# Patient Record
Sex: Female | Born: 1945 | Hispanic: No | Marital: Married | Smoking: Never smoker
Health system: Southern US, Community
[De-identification: ages and names within clinical notes are randomized; demographics above are authoritative.]

## PROBLEM LIST (undated history)

## (undated) DIAGNOSIS — I10 Essential (primary) hypertension: Secondary | ICD-10-CM

## (undated) DIAGNOSIS — J45909 Unspecified asthma, uncomplicated: Secondary | ICD-10-CM

## (undated) DIAGNOSIS — D649 Anemia, unspecified: Secondary | ICD-10-CM

## (undated) DIAGNOSIS — E079 Disorder of thyroid, unspecified: Secondary | ICD-10-CM

## (undated) HISTORY — DX: Essential (primary) hypertension: I10

## (undated) HISTORY — DX: Unspecified asthma, uncomplicated: J45.909

## (undated) HISTORY — DX: Anemia, unspecified: D64.9

## (undated) HISTORY — DX: Disorder of thyroid, unspecified: E07.9

---

## 2000-03-15 ENCOUNTER — Ambulatory Visit (HOSPITAL_COMMUNITY): Admission: RE | Admit: 2000-03-15 | Discharge: 2000-03-15 | Payer: Self-pay | Admitting: Family Medicine

## 2001-05-29 HISTORY — PX: HEMORRHOID SURGERY: SHX153

## 2004-03-23 ENCOUNTER — Ambulatory Visit (HOSPITAL_COMMUNITY): Admission: RE | Admit: 2004-03-23 | Discharge: 2004-03-23 | Payer: Self-pay | Admitting: *Deleted

## 2004-03-23 ENCOUNTER — Encounter (INDEPENDENT_AMBULATORY_CARE_PROVIDER_SITE_OTHER): Payer: Self-pay | Admitting: *Deleted

## 2009-03-09 ENCOUNTER — Telehealth: Payer: Self-pay | Admitting: Internal Medicine

## 2010-06-28 NOTE — Procedures (Signed)
Summary: COLON ([place])   Colonoscopy  Procedure date:  03/23/2004  Findings:      Location:  [facility].    Procedures Next Due Date:    Colonoscopy: 03/2014 Patient Name: [name] [name] MRN:  Procedure Procedures: Colonoscopy CPT: [phone].  Personnel: Endoscopist: Roosvelt Harps, MD.  Referred By: [name] Harries, MD.  Exam Location: Exam performed in Endoscopy Suite. Outpatient  Patient Consent: Procedure, Alternatives, Risks and Benefits discussed, consent obtained, from patient. Consent was obtained by the RN. Consent to be contacted was not given.  Indications Symptoms: Proctalgia.  Average Risk Screening Routine.  History  Current Medications: Patient is not currently taking Coumadin.  Allergies: No known allergies.  Patient Habits Patient smokes. Drinking Status: not currently drinking.  Pre-Exam Physical: Cardio-pulmonary exam, Rectal exam, HEENT exam , Abdominal exam, Extremity exam, Neurological exam WNL.  Exam Exam: Extent of exam reached: Cecum, extent intended: Cecum.  The cecum was identified by appendiceal orifice and IC valve. Patient position: on left side. Colon retroflexion performed. Images taken. ASA Classification: I. Tolerance: excellent.  Monitoring: Pulse and BP monitoring, Oximetry used. Supplemental O2 given.  Colon Prep Used Phospho Soda for colon prep. Prep results: excellent.  Fluoroscopy: Fluoroscopy was not used.  Sedation Meds: Patient assessed and found to be appropriate for moderate (conscious) sedation. Sedation was managed by the Endoscopist. Demerol 60 mg. given IV. Versed 8 mg. given IV.  Findings IMAGE TAKEN: Ascending Colon.  Image #2 attached.  Comments:  Normal.  IMAGE TAKEN: Cecum.  Image #1 attached.  Comments:  Normal.  IMAGE TAKEN: Sigmoid Colon.  Image #3 attached.  Comments:  Normal.  IMAGE TAKEN: Rectum.  Image #4 attached.  Comments:  Normal.   Assessment Normal examination.    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  Unplanned Events: There were no complications. Plans  Post Exam Instructions: Post sedation instructions given.  Medication Plan: Continue current medications.  Patient Education: Patient given standard instructions for: a normal exam. Patient instructed to get routine colonoscopy every 10 years.  Disposition: After procedure patient sent to recovery. After recovery patient sent home.  Scheduling/Referral: Follow-Up prn.

## 2010-06-28 NOTE — Progress Notes (Signed)
Summary: TRIAGE  Phone Note Call from Patient Call back at Home Phone [phone]   Caller: Daughter-Sylvia    Call For: Dr. Juanda Chance Reason for Call: Talk to Nurse Summary of Call: wants to be worked in for hemorrhoids Initial call taken by: Vallarie Mare,  March 09, 2009 11:58 AM  Follow-up for Phone Call        (Had an appt. on 02-04-09, pt. daughter cancelled it.)  Message left for patient to callback.Laureen Ochs LPN  March 09, 2009 12:52 PM Message left for patient to callback.Laureen Ochs LPN  March 10, 2009 10:14 AM  Follow-up by: Laureen Ochs LPN,  March 10, 2009 1:33 PM  Additional Follow-up for Phone Call Additional follow up Details #1::        Pt. daughter states her mother is having rectal pain and hemorrhoids. Pt. has seen other GI MD's in [place], but doesn't want to go back, she wants more opinions because nobody can find out whats wrong. I have advised pt. daughter to have pt. GI records faxed to me and I will have Dr.Brodie review and advise. Additional Follow-up by: Laureen Ochs LPN,  March 10, 2009 1:40 PM    Additional Follow-up for Phone Call Additional follow up Details #2::    I agree.DB Follow-up by: Hart Carwin MD,  March 10, 2009 2:02 PM

## 2010-10-25 ENCOUNTER — Other Ambulatory Visit: Payer: Self-pay | Admitting: Obstetrics & Gynecology

## 2010-10-25 DIAGNOSIS — Z1231 Encounter for screening mammogram for malignant neoplasm of breast: Secondary | ICD-10-CM

## 2010-11-03 ENCOUNTER — Ambulatory Visit (HOSPITAL_COMMUNITY): Payer: Self-pay

## 2010-11-17 ENCOUNTER — Ambulatory Visit (HOSPITAL_COMMUNITY)
Admission: RE | Admit: 2010-11-17 | Discharge: 2010-11-17 | Disposition: A | Discharge status: Home / Self Care | Payer: BC Managed Care – PPO | Source: Ambulatory Visit | Attending: Obstetrics & Gynecology | Admitting: Obstetrics & Gynecology

## 2010-11-17 DIAGNOSIS — Z1231 Encounter for screening mammogram for malignant neoplasm of breast: Secondary | ICD-10-CM | POA: Insufficient documentation

## 2011-07-17 ENCOUNTER — Ambulatory Visit (INDEPENDENT_AMBULATORY_CARE_PROVIDER_SITE_OTHER): Payer: BC Managed Care – PPO | Admitting: Family Medicine

## 2011-07-17 DIAGNOSIS — Z789 Other specified health status: Secondary | ICD-10-CM

## 2011-07-17 DIAGNOSIS — G4700 Insomnia, unspecified: Secondary | ICD-10-CM

## 2011-07-17 DIAGNOSIS — D649 Anemia, unspecified: Secondary | ICD-10-CM

## 2011-07-17 DIAGNOSIS — I10 Essential (primary) hypertension: Secondary | ICD-10-CM

## 2011-07-17 DIAGNOSIS — R51 Headache: Secondary | ICD-10-CM

## 2011-07-17 LAB — LIPID PANEL
Cholesterol: 186 mg/dL (ref 0–200)
HDL: 54 mg/dL (ref >39)
LDL Cholesterol: 120 mg/dL — ABNORMAL HIGH (ref 0–99)
Total CHOL/HDL Ratio: 3.4 Ratio
Triglycerides: 59 mg/dL (ref <150)
VLDL: 12 mg/dL (ref 0–40)

## 2011-07-17 LAB — FERRITIN: Ferritin: 88 ng/mL (ref 10–291)

## 2011-07-17 LAB — BASIC METABOLIC PANEL
BUN: 21 mg/dL (ref 6–23)
CO2: 25 mEq/L (ref 19–32)
Calcium: 9.5 mg/dL (ref 8.4–10.5)
Chloride: 108 mEq/L (ref 96–112)
Creat: 0.65 mg/dL (ref 0.50–1.10)
Glucose, Bld: 102 mg/dL — ABNORMAL HIGH (ref 70–99)
Potassium: 4.4 mEq/L (ref 3.5–5.3)
Sodium: 142 mEq/L (ref 135–145)

## 2011-07-17 LAB — POCT CBC
Granulocyte percent: 64.5 %G (ref 37–80)
HCT, POC: 33.6 % — AB (ref 37.7–47.9)
Hemoglobin: 10.5 g/dL — AB (ref 12.2–16.2)
Lymph, poc: 1.7 (ref 0.6–3.4)
MCH, POC: 20.3 pg — AB (ref 27–31.2)
MCHC: 31.3 g/dL — AB (ref 31.8–35.4)
MCV: 64.9 fL — AB (ref 80–97)
MID (cbc): 0.4 (ref 0–0.9)
POC Granulocyte: 3.7 (ref 2–6.9)
POC LYMPH PERCENT: 29.2 %L (ref 10–50)
POC MID %: 6.3 %M (ref 0–12)
Platelet Count, POC: 198 10*3/uL (ref 142–424)
RBC: 5.18 M/uL (ref 4.04–5.48)
RDW, POC: 16.1 %
WBC: 5.8 10*3/uL (ref 4.6–10.2)

## 2011-07-17 MED ORDER — ALPRAZOLAM 0.25 MG PO TABS: 0.2500 mg | ORAL_TABLET | Freq: Every evening | ORAL | Status: AC | PRN

## 2011-07-17 MED ORDER — ENALAPRIL MALEATE 10 MG PO TABS: 10.0000 mg | ORAL_TABLET | Freq: Every day | ORAL | Status: DC

## 2011-07-17 NOTE — Progress Notes (Signed)
Patient Name: [name] [name] Date of Birth: 1946/04/26 Medical Record Number: [id number] Gender: female Date of Encounter: 07/17/2011  History of Present Illness:  [name] [name] is a 66 y.o. very pleasant female patient who presents with the following:  [place] there is some language barrier.  She has noticed a problem with elevated BP, so she started herself on a [place] medication (enalapril 5mg  equivalent?)  Has been taking this for about 3 weeks.  She noticed a HA which made her concerned about her BP.  Unsure if medication helped with her HA.   Has had a left sided HA every day for the last 20 days more or less.  Better when she is sleeping. Has been taking excedrin one or 2 daily every day since the HA began.   Also has insomnia so she takes a medicatoin called lecsotan (I think a benzodiazepine, also from [place] every night.  This does help with her sleep. Family history of HTN.   Fasting today.    Patient Active Problem List  Diagnoses  . HTN (hypertension)   History reviewed. [name] pertinent past medical history. Past Surgical History  Procedure Date  . Hemorrhoid surgery 2003   History  Substance Use Topics  . Smoking status: Never Smoker   . Smokeless tobacco: Never Used  . Alcohol Use: [name]   Family History  Problem Relation Age of Onset  . Hypertension Mother   . Hypertension Father   . Hypertension Sister    [name] Known Allergies  Medication list has been reviewed and updated.  Review of Systems: As per HPI- otherwise negative.  [name] vision change  Physical Examination: Filed Vitals:   07/17/11 0916  BP: 147/83  Pulse: 62  Temp: 98.3 F (36.8 C)  TempSrc: Oral  Resp: 18  Height: 5' 1.5" (1.562 m)  Weight: 138 lb (62.596 kg)    Body mass index is 25.65 kg/(m^2).  GEN: WDWN, NAD, Non-toxic, A & O x 3.  Looks comfortable.  HEENT: Atraumatic, Normocephalic. Neck supple. [name] masses, [name] LAD.  [name] nodules or tenderness over her left temple.  PEERL, EOMI Ears and Nose: [name] external deformity.  TM wnl, oropharynx wnl.  CV: RRR, [name] M/G/R. [name] JVD. [name] thrill. [name] extra heart sounds. PULM: CTA B, [name] wheezes, crackles, rhonchi. [name] retractions. [name] resp. distress. [name] accessory muscle use. ABD: S, NT, ND, +BS. [name] rebound. [name] HSM. EXTR: [name] c/c/e NEURO Normal gait. Upper and lower extremitis with normal strength and sensation, DTR wnl biceps and patellar tendons PSYCH: Normally interactive. Conversant. Not depressed or anxious appearing.  Calm demeanor.   EKG shows NSR, compared to a past EKG, there are [name] changes.  [name] ST elevation or depression.   Results for orders placed in visit on 07/17/11  POCT CBC      Component Value Range   WBC 5.8  4.6 - 10.2 (K/uL)   Lymph, poc 1.7  0.6 - 3.4    POC LYMPH PERCENT 29.2  10 - 50 (%L)   MID (cbc) 0.4  0 - 0.9    POC MID % 6.3  0 - 12 (%M)   POC Granulocyte 3.7  2 - 6.9    Granulocyte percent 64.5  37 - 80 (%G)   RBC 5.18  4.04 - 5.48 (M/uL)   Hemoglobin 10.5 (*) 12.2 - 16.2 (g/dL)   HCT, POC 40.9 (*) 81.1 - 47.9 (%)   MCV 64.9 (*) 80 - 97 (fL)   MCH, POC 20.3 (*)  27 - 31.2 (pg)   MCHC 31.3 (*) 31.8 - 35.4 (g/dL)   RDW, POC 56.2     Platelet Count, POC 198  142 - 424 (K/uL)   MPV    0 - 99.8 (fL)    Assessment and Plan: 1. HTN (hypertension)  EKG 12-Lead, Basic metabolic panel, Lipid panel, POCT CBC, enalapril (VASOTEC) 10 MG tablet  2. Insomnia  ALPRAZolam (XANAX) 0.25 MG tablet  3. Language Barrier    4. Headache  POCT SEDIMENTATION RATE  5. Anemia  Ferritin   Microcytic anemia: Per patient her last colonoscopy was 3 years ago [place] and it was ok.  Will check ferritin and replace iron as needed.   HA:  Sed rate to rule out TA.  It looks like the lab missed this order so I will request it as a send out. She may be having a rebound HA as a result of taking excedrin every day.  Asked her to D/C this and use plain tylenol if needed.   Insomnia: gave limited supply of xanax for use  qhs prn HTN: continue enalapril, but increased the dose to 10mg  There is some language barrier.  Ms. Meetze is able to understand English but extra time is required.    Will follow up with the rest of her labs.  Plan recheck for BP evaluation soon.  Ms. Brickel is instructed to go to the ED if her HA becomes worse or different.

## 2011-07-20 ENCOUNTER — Telehealth: Payer: Self-pay

## 2011-07-20 ENCOUNTER — Encounter: Payer: Self-pay | Admitting: Family Medicine

## 2011-07-20 NOTE — Telephone Encounter (Signed)
Patient's daughter [name] [name] called to relay information regarding her mother. She states that she was returning a call from Dr. Patsy Lager.  Pt's daughter states that she is feeling better on the new medicine with normal blood pressures and is no longer having headaches.  Pt's daughter would like to know how long her mother should continue to take this medication.  Please call patient or patient's daughter at [phone].

## 2011-07-21 NOTE — Telephone Encounter (Signed)
Called and LM with [name] [name]- so glad her mother is doing well!  Plan to continue the BP med probably long term- we do need to check her back in a couple of weeks to recheck BP and we can go over labs in more detail then- and we need to follow- up on her anemia.

## 2011-09-10 ENCOUNTER — Other Ambulatory Visit: Payer: Self-pay | Admitting: Family Medicine

## 2011-10-08 ENCOUNTER — Other Ambulatory Visit: Payer: Self-pay | Admitting: Family Medicine

## 2013-04-18 ENCOUNTER — Ambulatory Visit (INDEPENDENT_AMBULATORY_CARE_PROVIDER_SITE_OTHER): Payer: BC Managed Care – PPO | Admitting: Internal Medicine

## 2013-04-18 VITALS — BP 120/76 | HR 65 | Temp 98.5°F | Resp 18 | Ht 62.5 in | Wt 147.0 lb

## 2013-04-18 DIAGNOSIS — IMO0001 Reserved for inherently not codable concepts without codable children: Secondary | ICD-10-CM

## 2013-04-18 DIAGNOSIS — Z760 Encounter for issue of repeat prescription: Secondary | ICD-10-CM

## 2013-04-18 DIAGNOSIS — R350 Frequency of micturition: Secondary | ICD-10-CM

## 2013-04-18 DIAGNOSIS — E049 Nontoxic goiter, unspecified: Secondary | ICD-10-CM

## 2013-04-18 DIAGNOSIS — I10 Essential (primary) hypertension: Secondary | ICD-10-CM

## 2013-04-18 DIAGNOSIS — D649 Anemia, unspecified: Secondary | ICD-10-CM

## 2013-04-18 LAB — IRON AND TIBC
%SAT: 30 % (ref 20–55)
Iron: 93 ug/dL (ref 42–145)
TIBC: 306 ug/dL (ref 250–470)
UIBC: 213 ug/dL (ref 125–400)

## 2013-04-18 LAB — POCT URINALYSIS DIPSTICK
Bilirubin, UA: NEGATIVE
Glucose, UA: NEGATIVE
Ketones, UA: NEGATIVE
Nitrite, UA: NEGATIVE
Protein, UA: NEGATIVE
Spec Grav, UA: 1.01
Urobilinogen, UA: 0.2
pH, UA: 7

## 2013-04-18 LAB — POCT CBC
Granulocyte percent: 64.1 %G (ref 37–80)
HCT, POC: 35.2 % — AB (ref 37.7–47.9)
Hemoglobin: 10.6 g/dL — AB (ref 12.2–16.2)
Lymph, poc: 1.9 (ref 0.6–3.4)
MCH, POC: 20.5 pg — AB (ref 27–31.2)
MCHC: 30.1 g/dL — AB (ref 31.8–35.4)
MCV: 68 fL — AB (ref 80–97)
MID (cbc): 0.4 (ref 0–0.9)
POC Granulocyte: 4.2 (ref 2–6.9)
POC LYMPH PERCENT: 29.1 %L (ref 10–50)
POC MID %: 6.8 %M (ref 0–12)
Platelet Count, POC: 187 10*3/uL (ref 142–424)
RBC: 5.17 M/uL (ref 4.04–5.48)
RDW, POC: 15.7 %
WBC: 6.5 10*3/uL (ref 4.6–10.2)

## 2013-04-18 LAB — POCT UA - MICROSCOPIC ONLY
Bacteria, U Microscopic: NEGATIVE
Casts, Ur, LPF, POC: NEGATIVE
Crystals, Ur, HPF, POC: NEGATIVE
Mucus, UA: NEGATIVE
Yeast, UA: NEGATIVE

## 2013-04-18 LAB — COMPREHENSIVE METABOLIC PANEL
ALT: 15 U/L (ref 0–35)
AST: 21 U/L (ref 0–37)
Albumin: 4.1 g/dL (ref 3.5–5.2)
Alkaline Phosphatase: 64 U/L (ref 39–117)
BUN: 16 mg/dL (ref 6–23)
CO2: 27 mEq/L (ref 19–32)
Calcium: 9 mg/dL (ref 8.4–10.5)
Chloride: 102 mEq/L (ref 96–112)
Creat: 0.6 mg/dL (ref 0.50–1.10)
Glucose, Bld: 92 mg/dL (ref 70–99)
Potassium: 4.2 mEq/L (ref 3.5–5.3)
Sodium: 139 mEq/L (ref 135–145)
Total Bilirubin: 0.9 mg/dL (ref 0.3–1.2)
Total Protein: 7.3 g/dL (ref 6.0–8.3)

## 2013-04-18 MED ORDER — ENALAPRIL MALEATE 10 MG PO TABS: 10.0000 mg | ORAL_TABLET | Freq: Every day | ORAL | Status: DC

## 2013-04-18 NOTE — Patient Instructions (Signed)
Goiter Goiter is an enlarged thyroid gland. The thyroid gland sits at the base of the front of the neck. The gland produces hormones that regulate mood, body temperature, pulse rate, and digestion. Most goiters are painless and are not a cause for serious concern. Goiters and conditions that cause goiters can be treated if necessary.  CAUSES  Common causes of goiter include:  Graves disease (causes too much hormone to be produced [hyperthyroidism]).  Hashimoto's disease (causes too little hormone to be produced [hypothyroidism]).  Thyroiditis (inflammation of the thyroid sometimes caused by virus or pregnancy).  Nodular goiter (small bumps form; sometimes called toxic nodular goiter).  Pregnancy.  Thyroid cancer (very few goiters with nodules are cancerous).  Certain medications.  Radiation exposure.  Iodine deficiency (more common in developing countries in inland populations). RISK FACTORS Risk factors for goiter include:  A family history of goiter.  Female gender.  Inadequate iodine in the diet.  Age older than 40 years. SYMPTOMS  Many goiters do not cause symptoms. When symptoms do occur, they may include:  Swelling in the lower part of the neck. This swelling can range from a very small bump to a large lump.  A tight feeling in the throat.  A hoarse voice. Less commonly, a goiter may result in:  Coughing.  Wheezing.  Difficulty swallowing.  Difficulty breathing.  Bulging neck veins.  Dizziness. When a goiter is the result of hyperthyroidism, symptoms may include:  Rapid or irregular heart beat.  Sicknessin your stomach (nausea).  Vomiting.  Diarrhea.  Shaking.  Irritable feeling.  Bulging eyes.  Weight loss.  Heat sensitivity.  Anxiety. When a goiter is the result of hypothyroidism, symptoms may include:  Tiredness.  Dry skin.  Constipation.  Weight gain.  Irregular menstrual cycle.  Depressed mood.  Sensitivity to  cold. DIAGNOSIS  Tests used to diagnose goiter include:  A physical exam.  Blood tests, including thyroid hormone levels and antibody testing.  Ultrasonography, computerized X-ray scan (computed tomography, CT) or computerized magnetic scan (magnetic resonance imaging, MRI).  Thyroid scan (imaging along with safe radioactive injection).  Tissue sample taken (biopsy) of nodules. This is sometimes done to confirm that the nodules are not cancerous. TREATMENT  Treatment will depend on the cause of the goiter. Treatment may include:  Monitoring. In some cases, no treatment is necessary, and your doctor will monitor yourcondition at regular check ups.  Medications and supplements. Thyroid medication (thyroid hormone replacement) is available for hyperthroidism and hypothyroidism.  If inflammation is the cause, over-the-counter medication or steroid medication may be recommended.  Goiters caused by iodine deficiency can be treated with iodine supplements or changes in diet.  Radioactive iodine treatment. Radioactive iodine is injected into the blood. It travels to the thyroid gland, kills thyroid cells, and reduces the size of the gland. This is only used when the thyroid gland is overactive. Lifelong thyroid hormone medication is often necessary after this treatment.  Surgery. A procedure to remove all or part of the gland may be recommended in severe cases or when cancer is the cause. Hormones can be taken to replace the hormones normally produced by the thyroid. HOME CARE INSTRUCTIONS   Take medications as directed.  Follow your caregiver's recommendations for any dietary changes.  Follow up with your caregiver for further examination and testing, as directed. PREVENTION   If you have a family history of goiter, discuss screening with your doctor.  Make sure you are getting enough iodine in your diet.  Use   of iodized table salt can help prevent iodine deficiency. Document  Released: 11/02/2009 Document Revised: 08/07/2011 Document Reviewed: 11/02/2009 [facility] Patient Information 2014 [place], [place]. DASH Diet The DASH diet stands for "Dietary Approaches to Stop Hypertension." It is a healthy eating plan that has been shown to reduce high blood pressure (hypertension) in as little as 14 days, while also possibly providing other significant health benefits. These other health benefits include reducing the risk of breast cancer after menopause and reducing the risk of type 2 diabetes, heart disease, colon cancer, and stroke. Health benefits also include weight loss and slowing kidney failure in patients with chronic kidney disease.  DIET GUIDELINES  Limit salt (sodium). Your diet should contain less than 1500 mg of sodium daily.  Limit refined or processed carbohydrates. Your diet should include mostly whole grains. Desserts and added sugars should be used sparingly.  Include small amounts of heart-healthy fats. These types of fats include nuts, oils, and tub margarine. Limit saturated and trans fats. These fats have been shown to be harmful in the body. CHOOSING FOODS  The following food groups are based on a 2000 calorie diet. See your Registered Dietitian for individual calorie needs. Grains and Grain Products (6 to 8 servings daily)  Eat More Often: Whole-wheat bread, brown rice, whole-grain or wheat pasta, quinoa, popcorn without added fat or salt (air popped).  Eat Less Often: White bread, white pasta, white rice, cornbread. Vegetables (4 to 5 servings daily)  Eat More Often: Fresh, frozen, and canned vegetables. Vegetables may be raw, steamed, roasted, or grilled with a minimal amount of fat.  Eat Less Often/Avoid: Creamed or fried vegetables. Vegetables in a cheese sauce. Fruit (4 to 5 servings daily)  Eat More Often: All fresh, canned (in natural juice), or frozen fruits. Dried fruits without added sugar. One hundred percent fruit juice ( cup [237 mL]  daily).  Eat Less Often: Dried fruits with added sugar. Canned fruit in light or heavy syrup. [employer], Fish, and Poultry (2 servings or less daily. One serving is 3 to 4 oz [85-114 g]).  Eat More Often: Ninety percent or leaner ground beef, tenderloin, sirloin. Round cuts of beef, chicken breast, [place] breast. All fish. Grill, bake, or broil your meat. Nothing should be fried.  Eat Less Often/Avoid: Fatty cuts of meat, [place], or chicken leg, thigh, or wing. Fried cuts of meat or fish. Dairy (2 to 3 servings)  Eat More Often: Low-fat or fat-free milk, low-fat plain or light yogurt, reduced-fat or part-skim cheese.  Eat Less Often/Avoid: Milk (whole, 2%).Whole milk yogurt. Full-fat cheeses. Nuts, Seeds, and Legumes (4 to 5 servings per week)  Eat More Often: All without added salt.  Eat Less Often/Avoid: Salted nuts and seeds, canned beans with added salt. Fats and Sweets (limited)  Eat More Often: Vegetable oils, tub margarines without trans fats, sugar-free gelatin. Mayonnaise and salad dressings.  Eat Less Often/Avoid: Coconut oils, palm oils, butter, stick margarine, cream, half and half, cookies, candy, pie. FOR MORE INFORMATION The Dash Diet Eating Plan: www.dashdiet.org Document Released: 05/04/2011 Document Revised: 08/07/2011 Document Reviewed: 05/04/2011 [facility] Patient Information 2014 [place], [place].

## 2013-04-18 NOTE — Progress Notes (Signed)
  Subjective:    Patient ID: [name] [name], female    DOB: [birth year], 67 y.o.   MRN: [id number]  HPI    Review of Systems     Objective:   Physical Exam        Assessment & Plan:

## 2013-04-18 NOTE — Progress Notes (Signed)
  Subjective:    Patient ID: [name] [name], female    DOB: 06-20-1945, 67 y.o.   MRN: [id number]  [facility] patient to me, has no primary care, speaks only fair english. Pt here for blood pressure recheck she takes enalapril. Her blood pressure been 150/80 in the morning but in the evening 100/60. Enalapril is good choice for her. She has no dizzy spells or syncope. Tolerates meds.  Chart review reveals anemia. No fatigue, anorexia, weight loss, or nite sweats. Review of Systems     Objective:   Physical Exam  Vitals reviewed. Constitutional: She is oriented to person, place, and time. She appears well-developed and well-nourished. No distress.  HENT:  Head: Normocephalic.  Neck: Normal range of motion. No tracheal deviation present. Thyromegaly present.  Cardiovascular: Normal rate, regular rhythm and normal heart sounds.   Pulmonary/Chest: Effort normal.  Musculoskeletal: Normal range of motion.  Lymphadenopathy:    She has no cervical adenopathy.  Neurological: She is alert and oriented to person, place, and time. She exhibits normal muscle tone. Coordination normal.  Skin: No rash noted.  Psychiatric: She has a normal mood and affect.    Results for orders placed in visit on 04/18/13  POCT CBC      Result Value Range   WBC 6.5  4.6 - 10.2 K/uL   Lymph, poc 1.9  0.6 - 3.4   POC LYMPH PERCENT 29.1  10 - 50 %L   MID (cbc) 0.4  0 - 0.9   POC MID % 6.8  0 - 12 %M   POC Granulocyte 4.2  2 - 6.9   Granulocyte percent 64.1  37 - 80 %G   RBC 5.17  4.04 - 5.48 M/uL   Hemoglobin 10.6 (*) 12.2 - 16.2 g/dL   HCT, POC 40.9 (*) 81.1 - 47.9 %   MCV 68.0 (*) 80 - 97 fL   MCH, POC 20.5 (*) 27 - 31.2 pg   MCHC 30.1 (*) 31.8 - 35.4 g/dL   RDW, POC 91.4     Platelet Count, POC 187  142 - 424 K/uL   MPV    0 - 99.8 fL  POCT URINALYSIS DIPSTICK      Result Value Range   Color, UA yellow     Clarity, UA clear     Glucose, UA neg     Bilirubin, UA neg     Ketones, UA neg     Spec  Grav, UA 1.010     Blood, UA trace-lysed     pH, UA 7.0     Protein, UA neg     Urobilinogen, UA 0.2     Nitrite, UA neg     Leukocytes, UA Trace    POCT UA - MICROSCOPIC ONLY      Result Value Range   WBC, Ur, HPF, POC 0-5     RBC, urine, microscopic 1-4     Bacteria, U Microscopic neg     Mucus, UA neg     Epithelial cells, urine per micros 1-4     Crystals, Ur, HPF, POC neg     Casts, Ur, LPF, POC neg     Yeast, UA neg     Anemia microcytic--iron/def versus thallasemia      Assessment & Plan:  RF meds 6 mo W/up anemia and goiter

## 2013-04-19 LAB — VITAMIN B12: Vitamin B-12: 363 pg/mL (ref 211–911)

## 2013-04-19 LAB — TSH: TSH: 2.17 u[IU]/mL (ref 0.350–4.500)

## 2013-04-19 LAB — FOLATE: Folate: >20 ng/mL

## 2013-04-21 LAB — IFOBT (OCCULT BLOOD): IFOBT: NEGATIVE

## 2013-04-22 LAB — HEMOGLOBINOPATHY EVALUATION
Hemoglobin Other: 0 %
Hgb A2 Quant: 5.2 % — ABNORMAL HIGH (ref 2.2–3.2)
Hgb A: 93.8 % — ABNORMAL LOW (ref 96.8–97.8)
Hgb F Quant: 1 % (ref 0.0–2.0)
Hgb S Quant: 0 %

## 2013-04-23 ENCOUNTER — Ambulatory Visit
Admission: RE | Admit: 2013-04-23 | Discharge: 2013-04-23 | Disposition: A | Discharge status: Home / Self Care | Payer: BC Managed Care – PPO | Source: Ambulatory Visit | Attending: Internal Medicine | Admitting: Internal Medicine

## 2013-04-23 DIAGNOSIS — E049 Nontoxic goiter, unspecified: Secondary | ICD-10-CM

## 2013-05-08 ENCOUNTER — Telehealth: Payer: Self-pay

## 2013-05-08 ENCOUNTER — Other Ambulatory Visit: Payer: Self-pay | Admitting: Internal Medicine

## 2013-05-08 DIAGNOSIS — E041 Nontoxic single thyroid nodule: Secondary | ICD-10-CM

## 2013-05-08 NOTE — Telephone Encounter (Signed)
Pt daughter is wanting to talk with someone about get a follow up appt for her mother either another ultrasound which original was 04/23/13 at [facility] imaging or a biopsy   Best number is [phone]

## 2013-05-08 NOTE — Telephone Encounter (Signed)
Left message for [name] [name] to call me back. Ordered the biopsy. Dr Perrin Maltese would like to discuss in office but patient has no insurance. Will explain in detail to daughter.

## 2013-05-12 NOTE — Telephone Encounter (Signed)
Ordered for 12/18.

## 2013-05-15 ENCOUNTER — Ambulatory Visit
Admission: RE | Admit: 2013-05-15 | Discharge: 2013-05-15 | Disposition: A | Discharge status: Home / Self Care | Payer: BC Managed Care – PPO | Source: Ambulatory Visit | Attending: Internal Medicine | Admitting: Internal Medicine

## 2013-05-15 ENCOUNTER — Other Ambulatory Visit (HOSPITAL_COMMUNITY)
Admission: RE | Admit: 2013-05-15 | Discharge: 2013-05-15 | Disposition: A | Discharge status: Home / Self Care | Payer: BC Managed Care – PPO | Source: Ambulatory Visit | Attending: Interventional Radiology | Admitting: Interventional Radiology

## 2013-05-15 DIAGNOSIS — E041 Nontoxic single thyroid nodule: Secondary | ICD-10-CM

## 2013-05-17 ENCOUNTER — Other Ambulatory Visit: Payer: Self-pay

## 2013-05-17 DIAGNOSIS — C73 Malignant neoplasm of thyroid gland: Secondary | ICD-10-CM

## 2013-05-19 ENCOUNTER — Telehealth: Payer: Self-pay

## 2013-05-19 DIAGNOSIS — E049 Nontoxic goiter, unspecified: Secondary | ICD-10-CM

## 2013-05-19 NOTE — Telephone Encounter (Signed)
I called patients daughter, explained everything. Agrees to take her mother to see surgeon.

## 2013-05-19 NOTE — Telephone Encounter (Signed)
Please advise.  Specimen Clinical Information The dominant nodule is solid in mid upper right lobe of 1.8 x 1.7 x 1.7 cm, Thyroid goiter Source Thyroid, Fine Needle Aspiration, Right Lobe Mid Upper, (Specimen 1 of 2, collected on 05/15/13 ) Gross Specimen: Received is/are 30 cc's of pale peach cytolyt solution and 6 slides in 95% ethyl alcohol. (PH:ph) Prepared: # Smears: 6 # Concentration Technique Slides (i.e. ThinPrep): 1 # Cell Block: Cell block attempted, but not obtained. 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 Readily identiable grooves are present. 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 Clinical correlation is advised. Dr. Luisa Hart has seen this case in consultation with agreement

## 2013-05-19 NOTE — Telephone Encounter (Signed)
Pt's daughter Bernita Buffy  would like to know the results of her mothers recent biopsy. She is extremely worried about this and would like to know the results asap. Best# [phone] (work #)

## 2013-05-20 NOTE — Telephone Encounter (Signed)
Thanks I have put in the referral for the surgeon.

## 2013-06-04 ENCOUNTER — Ambulatory Visit (INDEPENDENT_AMBULATORY_CARE_PROVIDER_SITE_OTHER): Payer: Medicare HMO | Admitting: Surgery

## 2013-06-04 ENCOUNTER — Encounter (INDEPENDENT_AMBULATORY_CARE_PROVIDER_SITE_OTHER): Payer: Self-pay | Admitting: Surgery

## 2013-06-04 VITALS — BP 148/82 | HR 74 | Resp 18 | Ht 62.0 in | Wt 144.0 lb

## 2013-06-04 DIAGNOSIS — E041 Nontoxic single thyroid nodule: Secondary | ICD-10-CM

## 2013-06-04 NOTE — Progress Notes (Signed)
Patient ID: [name] [name], female   DOB: July 17, 1945, 68 y.o.   MRN: [id number]  Chief Complaint  Patient presents with  . Other    Eval thyroid ca    HPI [name] [name] is a 68 y.o. female.   HPI This is a pleasant female referred by Dr. Elder Cyphers for evaluation of thyroid nodules. She has had a goiter for the last 30 years. Most recently she had an ultrasound showing a dominant nodule in each lobe. Fine needle aspiration from the right lobe showed both follicular and Hurthle cells. She has been referred here for consideration for thyroidectomy. She is currently asymptomatic.  She has no other complaints. She has no difficulty swallowing from the goiter. She speaks limited English and is accompanied by her daughter who interprets for her Past Medical History  Diagnosis Date  . Hypertension   . Thyroid disease   . Asthma   . Anemia     Past Surgical History  Procedure Laterality Date  . Hemorrhoid surgery  2003    Family History  Problem Relation Age of Onset  . Hypertension Mother   . Hypertension Father   . Hypertension Sister     Social History History  Substance Use Topics  . Smoking status: Never Smoker   . Smokeless tobacco: Never Used  . Alcohol Use: No    No Known Allergies  Current Outpatient Prescriptions  Medication Sig Dispense Refill  . enalapril (VASOTEC) 10 MG tablet Take 1 tablet (10 mg total) by mouth daily.  90 tablet  1   No current facility-administered medications for this visit.    Review of Systems Review of Systems  Constitutional: Negative for fever, chills and unexpected weight change.  HENT: Negative for congestion, hearing loss, sore throat, trouble swallowing and voice change.   Eyes: Negative for visual disturbance.  Respiratory: Negative for cough and wheezing.   Cardiovascular: Negative for chest pain, palpitations and leg swelling.  Gastrointestinal: Negative for nausea, vomiting, abdominal pain, diarrhea, constipation,  blood in stool, abdominal distention and anal bleeding.  Genitourinary: Negative for hematuria, vaginal bleeding and difficulty urinating.  Musculoskeletal: Negative for arthralgias.  Skin: Negative for rash and wound.  Neurological: Negative for seizures, syncope and headaches.  Hematological: Negative for adenopathy. Does not bruise/bleed easily.  Psychiatric/Behavioral: Negative for confusion.    Blood pressure 148/82, pulse 74, resp. rate 18, height 5\' 2"  (1.575 m), weight 144 lb (65.318 kg).  Physical Exam Physical Exam  Constitutional: She is oriented to person, place, and time. She appears well-developed and well-nourished. No distress.  HENT:  Head: Normocephalic and atraumatic.  Right Ear: External ear normal.  Left Ear: External ear normal.  Nose: Nose normal.  Mouth/Throat: Oropharynx is clear and moist. No oropharyngeal exudate.  Eyes: Conjunctivae are normal. Pupils are equal, round, and reactive to light. Right eye exhibits no discharge. Left eye exhibits no discharge. No scleral icterus.  Neck: Normal range of motion. No tracheal deviation present. Thyromegaly present.  Cardiovascular: Normal rate, regular rhythm, normal heart sounds and intact distal pulses.   Pulmonary/Chest: Effort normal and breath sounds normal. She has no wheezes.  Abdominal: Soft. Bowel sounds are normal. She exhibits no distension. There is no tenderness.  Musculoskeletal: Normal range of motion. She exhibits no edema and no tenderness.  Lymphadenopathy:    She has no cervical adenopathy.  Neurological: She is alert and oriented to person, place, and time.  Skin: Skin is warm and dry. No rash noted. She is not  diaphoretic. No erythema.  Psychiatric: Her behavior is normal. Judgment normal.    Data Reviewed I have reviewed her ultrasound as well as her pathology report. She has a dominant nodule in both the right and left thyroid lobes as well as other small nodules. The FNA from the left  thyroid nodule is consistent with goiter. The FNA from the right thyroid nodule shows both follicular and Hurthle cells. Malignancy cannot be ruled out  Assessment    Thyroid nodule with Hrthle cells     Plan    I discussed this with the patient and her daughter in detail. I would recommend right thyroid lobectomy so they can be determined whether or not malignancy is present. I discussed the surgical procedure with them in detail and gave him literature regarding the surgery. I explained a total thyroidectomy would be necessary should malignancy be found in the right thyroid nodule. At this point, therefore discussed this with the family before deciding whether or not to proceed with surgery        Chenay Nesmith A 06/04/2013, 2:35 PM

## 2013-06-06 ENCOUNTER — Telehealth (INDEPENDENT_AMBULATORY_CARE_PROVIDER_SITE_OTHER): Payer: Self-pay | Admitting: Surgery

## 2013-06-06 NOTE — Telephone Encounter (Signed)
Pt would like proceed with surgery  They have additional question  Please contact [facility] # [phone]

## 2013-06-09 ENCOUNTER — Other Ambulatory Visit (INDEPENDENT_AMBULATORY_CARE_PROVIDER_SITE_OTHER): Payer: Self-pay | Admitting: Surgery

## 2013-06-18 ENCOUNTER — Encounter (HOSPITAL_COMMUNITY): Payer: Self-pay

## 2013-06-18 ENCOUNTER — Encounter (HOSPITAL_COMMUNITY)
Admission: RE | Admit: 2013-06-18 | Discharge: 2013-06-18 | Disposition: A | Discharge status: Home / Self Care | Payer: Medicare HMO | Source: Ambulatory Visit | Attending: Surgery | Admitting: Surgery

## 2013-06-18 ENCOUNTER — Encounter (HOSPITAL_COMMUNITY): Payer: Self-pay | Admitting: Pharmacy Technician

## 2013-06-18 DIAGNOSIS — Z01818 Encounter for other preprocedural examination: Secondary | ICD-10-CM | POA: Insufficient documentation

## 2013-06-18 DIAGNOSIS — Z01812 Encounter for preprocedural laboratory examination: Secondary | ICD-10-CM | POA: Insufficient documentation

## 2013-06-18 DIAGNOSIS — Z01810 Encounter for preprocedural cardiovascular examination: Secondary | ICD-10-CM | POA: Insufficient documentation

## 2013-06-18 DIAGNOSIS — Z01811 Encounter for preprocedural respiratory examination: Secondary | ICD-10-CM | POA: Insufficient documentation

## 2013-06-18 LAB — BASIC METABOLIC PANEL
BUN: 13 mg/dL (ref 6–23)
CO2: 27 mEq/L (ref 19–32)
Calcium: 9.1 mg/dL (ref 8.4–10.5)
Chloride: 102 mEq/L (ref 96–112)
Creatinine, Ser: 0.65 mg/dL (ref 0.50–1.10)
GFR calc Af Amer: >90 mL/min (ref >90)
GFR calc non Af Amer: >90 mL/min (ref >90)
Glucose, Bld: 107 mg/dL — ABNORMAL HIGH (ref 70–99)
Potassium: 3.9 mEq/L (ref 3.7–5.3)
Sodium: 142 mEq/L (ref 137–147)

## 2013-06-18 LAB — CBC
HCT: 34 % — ABNORMAL LOW (ref 36.0–46.0)
Hemoglobin: 10.8 g/dL — ABNORMAL LOW (ref 12.0–15.0)
MCH: 20.9 pg — ABNORMAL LOW (ref 26.0–34.0)
MCHC: 31.8 g/dL (ref 30.0–36.0)
MCV: 65.8 fL — ABNORMAL LOW (ref 78.0–100.0)
Platelets: 176 10*3/uL (ref 150–400)
RBC: 5.17 MIL/uL — ABNORMAL HIGH (ref 3.87–5.11)
RDW: 15.3 % (ref 11.5–15.5)
WBC: 6.1 10*3/uL (ref 4.0–10.5)

## 2013-06-18 NOTE — Progress Notes (Signed)
Pt denied need for  interpreter at PAT , husband at side and able to understand english better than pt.  When asked if daughter would be here DOS to interpret husband states did not know.  Questioned again if needed an interpreter DOS and refused.

## 2013-06-18 NOTE — Pre-Procedure Instructions (Signed)
TRACIA LACOMB  06/18/2013   Your procedure is scheduled on:  June 24, 2013  Report to [facility] (Entrance A) at 12:30 PM.  Call this number if you have problems the morning of surgery: [phone]   Remember:   Do not eat food or drink liquids after midnight.   Take these medicines the morning of surgery with A SIP OF WATER: none   Discontinue aspirin, coumadin, effient, plavix and aspirin containing medications 5 days prior to surgery.  Stop NSAIDS ie: ibuprofen, naproxen   Do not wear jewelry, make-up or nail polish.  Do not wear lotions, powders, or perfumes. You may wear deodorant.  Do not shave 48 hours prior to surgery. Men may shave face and neck.  Do not bring valuables to the hospital.  [facility] is not responsible  for any belongings or valuables.               Contacts, dentures or bridgework may not be worn into surgery.  Leave suitcase in the car. After surgery it may be brought to your room.  For patients admitted to the hospital, discharge time is determined by your                treatment team.               Patients discharged the day of surgery will not be allowed to drive  home.  Name and phone number of your driver:   Special Instructions: Shower using CHG 2 nights before surgery and the night before surgery.  If you shower the day of surgery use CHG.  Use special wash - you have one bottle of CHG for all showers.  You should use approximately 1/3 of the bottle for each shower.   Please read over the following fact sheets that you were given: Pain Booklet, Coughing and Deep Breathing and Surgical Site Infection Prevention

## 2013-06-19 ENCOUNTER — Other Ambulatory Visit (HOSPITAL_COMMUNITY): Payer: BC Managed Care – PPO

## 2013-06-23 MED ORDER — CEFAZOLIN SODIUM-DEXTROSE 2-3 GM-% IV SOLR
2.0000 g | INTRAVENOUS | Status: AC
  Administered 2013-06-24: 2 g via INTRAVENOUS
  Filled 2013-06-23: qty 50

## 2013-06-23 NOTE — H&P (Signed)
Chief Complaint   Patient presents with   .  Other     Eval thyroid nodule  HPI  [name] [name] is a 68 y.o. female.  HPI  This is a pleasant female referred by Dr. Elder Cyphers for evaluation of thyroid nodules. She has had a goiter for the last 30 years. Most recently she had an ultrasound showing a dominant nodule in each lobe. Fine needle aspiration from the right lobe showed both follicular and Hurthle cells. She has been referred here for consideration for thyroidectomy. She is currently asymptomatic. She has no other complaints. She has no difficulty swallowing from the goiter. She speaks limited English and is accompanied by her daughter who interprets for her  Past Medical History   Diagnosis  Date   .  Hypertension    .  Thyroid disease    .  Asthma    .  Anemia     Past Surgical History   Procedure  Laterality  Date   .  Hemorrhoid surgery   2003    Family History   Problem  Relation  Age of Onset   .  Hypertension  Mother    .  Hypertension  Father    .  Hypertension  Sister    Social History  History   Substance Use Topics   .  Smoking status:  Never Smoker   .  Smokeless tobacco:  Never Used   .  Alcohol Use:  No   No Known Allergies  Current Outpatient Prescriptions   Medication  Sig  Dispense  Refill   .  enalapril (VASOTEC) 10 MG tablet  Take 1 tablet (10 mg total) by mouth daily.  90 tablet  1    No current facility-administered medications for this visit.   Review of Systems  Review of Systems  Constitutional: Negative for fever, chills and unexpected weight change.  HENT: Negative for congestion, hearing loss, sore throat, trouble swallowing and voice change.  Eyes: Negative for visual disturbance.  Respiratory: Negative for cough and wheezing.  Cardiovascular: Negative for chest pain, palpitations and leg swelling.  Gastrointestinal: Negative for nausea, vomiting, abdominal pain, diarrhea, constipation, blood in stool, abdominal distention and anal  bleeding.  Genitourinary: Negative for hematuria, vaginal bleeding and difficulty urinating.  Musculoskeletal: Negative for arthralgias.  Skin: Negative for rash and wound.  Neurological: Negative for seizures, syncope and headaches.  Hematological: Negative for adenopathy. Does not bruise/bleed easily.  Psychiatric/Behavioral: Negative for confusion.  Blood pressure 148/82, pulse 74, resp. rate 18, height 5\' 2"  (1.575 m), weight 144 lb (65.318 kg).  Physical Exam  Physical Exam  Constitutional: She is oriented to person, place, and time. She appears well-developed and well-nourished. No distress.  HENT:  Head: Normocephalic and atraumatic.  Right Ear: External ear normal.  Left Ear: External ear normal.  Nose: Nose normal.  Mouth/Throat: Oropharynx is clear and moist. No oropharyngeal exudate.  Eyes: Conjunctivae are normal. Pupils are equal, round, and reactive to light. Right eye exhibits no discharge. Left eye exhibits no discharge. No scleral icterus.  Neck: Normal range of motion. No tracheal deviation present. Thyromegaly present.  Cardiovascular: Normal rate, regular rhythm, normal heart sounds and intact distal pulses.  Pulmonary/Chest: Effort normal and breath sounds normal. She has no wheezes.  Abdominal: Soft. Bowel sounds are normal. She exhibits no distension. There is no tenderness.  Musculoskeletal: Normal range of motion. She exhibits no edema and no tenderness.  Lymphadenopathy:  She has no cervical adenopathy.  Neurological: She is alert and oriented to person, place, and time.  Skin: Skin is warm and dry. No rash noted. She is not diaphoretic. No erythema.  Psychiatric: Her behavior is normal. Judgment normal.  Data Reviewed  I have reviewed her ultrasound as well as her pathology report. She has a dominant nodule in both the right and left thyroid lobes as well as other small nodules. The FNA from the left thyroid nodule is consistent with goiter. The FNA from the  right thyroid nodule shows both follicular and Hurthle cells. Malignancy cannot be ruled out   Assessment  Thyroid nodule with Hrthle cells   Plan  I discussed this with the patient and her daughter in detail. I would recommend right thyroid lobectomy so they can be determined whether or not malignancy is present. I discussed the surgical procedure with them in detail and gave him literature regarding the surgery. I explained a total thyroidectomy would be necessary should malignancy be found in the right thyroid nodule. I explained the risks of surgery in detail. These risks include but are not limited to bleeding, infection, injury to surrounding structures, injury to the nerves to the vocal cords, again need for further surgery, et Ronney Asters. She understands and wishes to proceed.

## 2013-06-24 ENCOUNTER — Encounter (HOSPITAL_COMMUNITY): Payer: Medicare HMO | Admitting: Certified Registered Nurse Anesthetist

## 2013-06-24 ENCOUNTER — Encounter (HOSPITAL_COMMUNITY): Payer: Self-pay | Admitting: *Deleted

## 2013-06-24 ENCOUNTER — Encounter (HOSPITAL_COMMUNITY)
Admission: RE | Disposition: A | Discharge status: Home / Self Care | Payer: Self-pay | Source: Ambulatory Visit | Attending: Surgery

## 2013-06-24 ENCOUNTER — Ambulatory Visit (HOSPITAL_COMMUNITY): Payer: Medicare HMO | Admitting: Certified Registered Nurse Anesthetist

## 2013-06-24 ENCOUNTER — Observation Stay (HOSPITAL_COMMUNITY)
Admission: RE | Admit: 2013-06-24 | Discharge: 2013-06-25 | Disposition: A | Discharge status: Home / Self Care | Payer: Medicare HMO | Source: Ambulatory Visit | Attending: Surgery | Admitting: Surgery

## 2013-06-24 DIAGNOSIS — I10 Essential (primary) hypertension: Secondary | ICD-10-CM | POA: Insufficient documentation

## 2013-06-24 DIAGNOSIS — J45909 Unspecified asthma, uncomplicated: Secondary | ICD-10-CM | POA: Insufficient documentation

## 2013-06-24 DIAGNOSIS — D649 Anemia, unspecified: Secondary | ICD-10-CM | POA: Insufficient documentation

## 2013-06-24 DIAGNOSIS — E063 Autoimmune thyroiditis: Secondary | ICD-10-CM

## 2013-06-24 DIAGNOSIS — E042 Nontoxic multinodular goiter: Principal | ICD-10-CM | POA: Insufficient documentation

## 2013-06-24 DIAGNOSIS — E041 Nontoxic single thyroid nodule: Secondary | ICD-10-CM | POA: Diagnosis present

## 2013-06-24 HISTORY — PX: THYROIDECTOMY: SHX17

## 2013-06-24 SURGERY — THYROIDECTOMY
Anesthesia: General | Site: Neck | Laterality: Right

## 2013-06-24 MED ORDER — LIDOCAINE HCL (CARDIAC) 20 MG/ML IV SOLN
INTRAVENOUS | Status: DC | PRN
  Administered 2013-06-24: 100 mg via INTRAVENOUS

## 2013-06-24 MED ORDER — MORPHINE SULFATE 2 MG/ML IJ SOLN
1.0000 mg | INTRAMUSCULAR | Status: DC | PRN
  Administered 2013-06-24 – 2013-06-25 (×3): 2 mg via INTRAVENOUS
  Filled 2013-06-24 (×3): qty 1

## 2013-06-24 MED ORDER — FENTANYL CITRATE 0.05 MG/ML IJ SOLN
INTRAMUSCULAR | Status: AC
Start: 2013-06-24 — End: 2013-06-24
  Filled 2013-06-24: qty 5

## 2013-06-24 MED ORDER — ENOXAPARIN SODIUM 40 MG/0.4ML ~~LOC~~ SOLN
40.0000 mg | SUBCUTANEOUS | Status: DC
  Filled 2013-06-24: qty 0.4

## 2013-06-24 MED ORDER — ONDANSETRON HCL 4 MG/2ML IJ SOLN
INTRAMUSCULAR | Status: AC
  Filled 2013-06-24: qty 2

## 2013-06-24 MED ORDER — HYDROCODONE-ACETAMINOPHEN 5-325 MG PO TABS: 1.0000 | ORAL_TABLET | ORAL | Status: DC | PRN

## 2013-06-24 MED ORDER — PROPOFOL 10 MG/ML IV BOLUS
INTRAVENOUS | Status: AC
  Filled 2013-06-24: qty 20

## 2013-06-24 MED ORDER — LIDOCAINE HCL (CARDIAC) 20 MG/ML IV SOLN
INTRAVENOUS | Status: AC
  Filled 2013-06-24: qty 5

## 2013-06-24 MED ORDER — PROPOFOL 10 MG/ML IV BOLUS
INTRAVENOUS | Status: DC | PRN
  Administered 2013-06-24: 100 mg via INTRAVENOUS

## 2013-06-24 MED ORDER — LACTATED RINGERS IV SOLN
INTRAVENOUS | Status: DC | PRN
  Administered 2013-06-24 (×2): via INTRAVENOUS

## 2013-06-24 MED ORDER — FENTANYL CITRATE 0.05 MG/ML IJ SOLN
INTRAMUSCULAR | Status: AC
  Filled 2013-06-24: qty 5

## 2013-06-24 MED ORDER — ROCURONIUM BROMIDE 50 MG/5ML IV SOLN
INTRAVENOUS | Status: AC
  Filled 2013-06-24: qty 1

## 2013-06-24 MED ORDER — MIDAZOLAM HCL 2 MG/2ML IJ SOLN
INTRAMUSCULAR | Status: AC
  Filled 2013-06-24: qty 2

## 2013-06-24 MED ORDER — HYDROMORPHONE HCL PF 1 MG/ML IJ SOLN
INTRAMUSCULAR | Status: AC
  Filled 2013-06-24: qty 1

## 2013-06-24 MED ORDER — ROCURONIUM BROMIDE 100 MG/10ML IV SOLN
INTRAVENOUS | Status: DC | PRN
  Administered 2013-06-24: 40 mg via INTRAVENOUS

## 2013-06-24 MED ORDER — FENTANYL CITRATE 0.05 MG/ML IJ SOLN
INTRAMUSCULAR | Status: DC | PRN
  Administered 2013-06-24: 150 ug via INTRAVENOUS
  Administered 2013-06-24 (×2): 50 ug via INTRAVENOUS

## 2013-06-24 MED ORDER — ACETAMINOPHEN 325 MG PO TABS: 650.0000 mg | ORAL_TABLET | Freq: Four times a day (QID) | ORAL | Status: DC | PRN

## 2013-06-24 MED ORDER — MIDAZOLAM HCL 5 MG/5ML IJ SOLN
INTRAMUSCULAR | Status: DC | PRN
  Administered 2013-06-24: 2 mg via INTRAVENOUS

## 2013-06-24 MED ORDER — 0.9 % SODIUM CHLORIDE (POUR BTL) OPTIME
TOPICAL | Status: DC | PRN
  Administered 2013-06-24: 1000 mL

## 2013-06-24 MED ORDER — GLYCOPYRROLATE 0.2 MG/ML IJ SOLN
INTRAMUSCULAR | Status: AC
  Filled 2013-06-24: qty 4

## 2013-06-24 MED ORDER — ONDANSETRON HCL 4 MG/2ML IJ SOLN
INTRAMUSCULAR | Status: DC | PRN
  Administered 2013-06-24: 4 mg via INTRAVENOUS

## 2013-06-24 MED ORDER — POTASSIUM CHLORIDE IN NACL 20-0.9 MEQ/L-% IV SOLN
INTRAVENOUS | Status: DC
  Administered 2013-06-24: 22:00:00 via INTRAVENOUS
  Filled 2013-06-24 (×2): qty 1000

## 2013-06-24 MED ORDER — ENALAPRIL MALEATE 10 MG PO TABS
10.0000 mg | ORAL_TABLET | Freq: Every day | ORAL | Status: DC
  Administered 2013-06-24: 10 mg via ORAL
  Filled 2013-06-24 (×2): qty 1

## 2013-06-24 MED ORDER — HYDROMORPHONE HCL PF 1 MG/ML IJ SOLN
0.2500 mg | INTRAMUSCULAR | Status: DC | PRN
  Administered 2013-06-24 (×3): 0.5 mg via INTRAVENOUS

## 2013-06-24 MED ORDER — GLYCOPYRROLATE 0.2 MG/ML IJ SOLN
INTRAMUSCULAR | Status: DC | PRN
  Administered 2013-06-24: .8 mg via INTRAVENOUS

## 2013-06-24 MED ORDER — LACTATED RINGERS IV SOLN
INTRAVENOUS | Status: DC
  Administered 2013-06-24: 12:00:00 via INTRAVENOUS

## 2013-06-24 MED ORDER — NEOSTIGMINE METHYLSULFATE 1 MG/ML IJ SOLN
INTRAMUSCULAR | Status: DC | PRN
  Administered 2013-06-24: 4 mg via INTRAVENOUS

## 2013-06-24 MED ORDER — IBUPROFEN 400 MG PO TABS: 400.0000 mg | ORAL_TABLET | Freq: Four times a day (QID) | ORAL | Status: DC | PRN

## 2013-06-24 MED ORDER — PHENYLEPHRINE HCL 10 MG/ML IJ SOLN
INTRAMUSCULAR | Status: DC | PRN
  Administered 2013-06-24 (×2): 40 ug via INTRAVENOUS

## 2013-06-24 MED ORDER — MICROFIBRILLAR COLL HEMOSTAT EX PADS
MEDICATED_PAD | CUTANEOUS | Status: DC | PRN
  Administered 2013-06-24: 1 via TOPICAL

## 2013-06-24 SURGICAL SUPPLY — 45 items
APL SKNCLS STERI-STRIP NONHPOA (GAUZE/BANDAGES/DRESSINGS) ×1
BENZOIN TINCTURE PRP APPL 2/3 (GAUZE/BANDAGES/DRESSINGS) ×2 IMPLANT
BLADE SURG 10 STRL SS (BLADE) ×2 IMPLANT
BLADE SURG 15 STRL LF DISP TIS (BLADE) ×1 IMPLANT
BLADE SURG 15 STRL SS (BLADE) ×2
CANISTER SUCTION 2500CC (MISCELLANEOUS) ×2 IMPLANT
CHLORAPREP W/TINT 10.5 ML (MISCELLANEOUS) ×2 IMPLANT
CLIP TI MEDIUM 24 (CLIP) ×2 IMPLANT
CLIP TI WIDE RED SMALL 24 (CLIP) ×2 IMPLANT
CLSR STERI-STRIP ANTIMIC 1/2X4 (GAUZE/BANDAGES/DRESSINGS) ×1 IMPLANT
CONT SPEC 4OZ CLIKSEAL STRL BL (MISCELLANEOUS) ×1 IMPLANT
COVER SURGICAL LIGHT HANDLE (MISCELLANEOUS) ×2 IMPLANT
CRADLE DONUT ADULT HEAD (MISCELLANEOUS) ×2 IMPLANT
DRAPE PED LAPAROTOMY (DRAPES) ×2 IMPLANT
DRAPE UTILITY 15X26 W/TAPE STR (DRAPE) ×4 IMPLANT
ELECT CAUTERY BLADE 6.4 (BLADE) ×2 IMPLANT
ELECT REM PT RETURN 9FT ADLT (ELECTROSURGICAL) ×2
ELECTRODE REM PT RTRN 9FT ADLT (ELECTROSURGICAL) ×1 IMPLANT
GAUZE SPONGE 4X4 16PLY XRAY LF (GAUZE/BANDAGES/DRESSINGS) ×2 IMPLANT
GLOVE SURG SIGNA 7.5 PF LTX (GLOVE) ×2 IMPLANT
GOWN STRL NON-REIN LRG LVL3 (GOWN DISPOSABLE) ×4 IMPLANT
GOWN STRL REIN XL XLG (GOWN DISPOSABLE) ×2 IMPLANT
KIT BASIN OR (CUSTOM PROCEDURE TRAY) ×2 IMPLANT
KIT ROOM TURNOVER OR (KITS) ×2 IMPLANT
NS IRRIG 1000ML POUR BTL (IV SOLUTION) ×2 IMPLANT
PACK SURGICAL SETUP 50X90 (CUSTOM PROCEDURE TRAY) ×2 IMPLANT
PAD ARMBOARD 7.5X6 YLW CONV (MISCELLANEOUS) ×4 IMPLANT
PENCIL BUTTON HOLSTER BLD 10FT (ELECTRODE) ×2 IMPLANT
SHEARS HARMONIC 9CM CVD (BLADE) ×2 IMPLANT
SPECIMEN JAR MEDIUM (MISCELLANEOUS) ×1 IMPLANT
SPONGE GAUZE 4X4 12PLY (GAUZE/BANDAGES/DRESSINGS) ×2 IMPLANT
SPONGE GAUZE 4X4 12PLY STER LF (GAUZE/BANDAGES/DRESSINGS) ×1 IMPLANT
SPONGE INTESTINAL PEANUT (DISPOSABLE) ×2 IMPLANT
STRIP CLOSURE SKIN 1/2X4 (GAUZE/BANDAGES/DRESSINGS) ×2 IMPLANT
SUT MNCRL AB 4-0 PS2 18 (SUTURE) ×2 IMPLANT
SUT SILK 2 0 (SUTURE) ×2
SUT SILK 2-0 18XBRD TIE 12 (SUTURE) ×1 IMPLANT
SUT SILK 3 0 (SUTURE) ×2
SUT SILK 3-0 18XBRD TIE 12 (SUTURE) ×1 IMPLANT
SUT VIC AB 3-0 SH 18 (SUTURE) ×2 IMPLANT
SYR BULB 3OZ (MISCELLANEOUS) ×2 IMPLANT
TOWEL OR 17X24 6PK STRL BLUE (TOWEL DISPOSABLE) ×2 IMPLANT
TOWEL OR 17X26 10 PK STRL BLUE (TOWEL DISPOSABLE) ×2 IMPLANT
TUBE CONNECTING 12X1/4 (SUCTIONS) ×2 IMPLANT
WATER STERILE IRR 1000ML POUR (IV SOLUTION) IMPLANT

## 2013-06-24 NOTE — Op Note (Signed)
THYROIDECTOMY  Procedure Note  [name] [name] 06/24/2013   Pre-op Diagnosis: thyroid nodules      Post-op Diagnosis: same  Procedure(s): RIGHT THYROID LOBECTOMY  Surgeon(s): Harl Bowie, MD Earnstine Regal, MD  Anesthesia: General  Staff:  Circulator: Tomma Rakers Sipsis, RN Relief Circulator: Margarita Grizzle, RN Scrub Person: Oletta Darter Schachtner; Quincy Carnes, RN Circulator Assistant: Levora Angel, RN  Estimated Blood Loss: Minimal               Specimens: sent to path          [facility] A   Date: 06/24/2013  Time: 5:11 PM

## 2013-06-24 NOTE — Anesthesia Preprocedure Evaluation (Signed)
Anesthesia Evaluation  Patient identified by MRN, date of birth, ID band Patient awake    Reviewed: Allergy & Precautions, H&P , NPO status , Patient's Chart, lab work & pertinent test results  Airway Mallampati: II      Dental   Pulmonary asthma ,  breath sounds clear to auscultation        Cardiovascular hypertension, Rhythm:Regular Rate:Normal     Neuro/Psych    GI/Hepatic negative GI ROS, Neg liver ROS,   Endo/Other  negative endocrine ROS  Renal/GU      Musculoskeletal   Abdominal   Peds  Hematology   Anesthesia Other Findings   Reproductive/Obstetrics                           Anesthesia Physical Anesthesia Plan  ASA: III  Anesthesia Plan: General   Post-op Pain Management:    Induction: Intravenous  Airway Management Planned: Oral ETT  Additional Equipment:   Intra-op Plan:   Post-operative Plan: Extubation in OR  Informed Consent: I have reviewed the patients History and Physical, chart, labs and discussed the procedure including the risks, benefits and alternatives for the proposed anesthesia with the patient or authorized representative who has indicated his/her understanding and acceptance.   Dental advisory given  Plan Discussed with: CRNA and Anesthesiologist  Anesthesia Plan Comments:         Anesthesia Quick Evaluation

## 2013-06-24 NOTE — Transfer of Care (Signed)
Immediate Anesthesia Transfer of Care Note  Patient: [name] [name]  Procedure(s) Performed: Procedure(s): THYROIDECTOMY (Right)  Patient Location: PACU  Anesthesia Type:General  Level of Consciousness: awake and alert   Airway & Oxygen Therapy: Patient Spontanous Breathing and Patient connected to nasal cannula oxygen  Post-op Assessment: Report given to PACU RN and Post -op Vital signs reviewed and stable  Post vital signs: Reviewed and stable  Complications: No apparent anesthesia complications

## 2013-06-24 NOTE — Preoperative (Signed)
Beta Blockers   Reason not to administer Beta Blockers:Not Applicable 

## 2013-06-24 NOTE — Anesthesia Postprocedure Evaluation (Signed)
  Anesthesia Post-op Note  Patient: [name] [name]  Procedure(s) Performed: Procedure(s): THYROIDECTOMY (Right)  Patient Location: PACU  Anesthesia Type:General  Level of Consciousness: awake  Airway and Oxygen Therapy: Patient Spontanous Breathing  Post-op Pain: mild  Post-op Assessment: Post-op Vital signs reviewed  Post-op Vital Signs: Reviewed  Complications: No apparent anesthesia complications

## 2013-06-24 NOTE — Anesthesia Procedure Notes (Signed)
Procedure Name: Intubation Date/Time: 06/24/2013 4:07 PM Performed by: Raphael Gibney T Pre-anesthesia Checklist: Patient identified, Timeout performed, Emergency Drugs available, Suction available and Patient being monitored Patient Re-evaluated:Patient Re-evaluated prior to inductionOxygen Delivery Method: Circle system utilized and Simple face mask Preoxygenation: Pre-oxygenation with 100% oxygen Intubation Type: IV induction Ventilation: Mask ventilation without difficulty Laryngoscope Size: Miller and 2 Grade View: Grade I Tube type: Oral Tube size: 7.5 mm Number of attempts: 1 Airway Equipment and Method: Patient positioned with wedge pillow and Stylet Placement Confirmation: ETT inserted through vocal cords under direct vision,  positive ETCO2 and breath sounds checked- equal and bilateral Secured at: 23 cm Tube secured with: Tape Dental Injury: Teeth and Oropharynx as per pre-operative assessment

## 2013-06-24 NOTE — Progress Notes (Signed)
Report to E. [place] Therapist, sports as primary caregiver.

## 2013-06-24 NOTE — Interval H&P Note (Signed)
History and Physical Interval Note: no change in H and P  06/24/2013 2:43 PM  [name] [name]  has presented today for surgery, with the diagnosis of thyroid nodules   The various methods of treatment have been discussed with the patient and family. After consideration of risks, benefits and other options for treatment, the patient has consented to  Procedure(s): THYROIDECTOMY (Right) as a surgical intervention .  The patient's history has been reviewed, patient examined, no change in status, stable for surgery.  I have reviewed the patient's chart and labs.  Questions were answered to the patient's satisfaction.     [name]

## 2013-06-25 ENCOUNTER — Encounter (INDEPENDENT_AMBULATORY_CARE_PROVIDER_SITE_OTHER): Payer: Self-pay | Admitting: General Surgery

## 2013-06-25 MED ORDER — HYDROCODONE-ACETAMINOPHEN 5-325 MG PO TABS: 1.0000 | ORAL_TABLET | ORAL | Status: DC | PRN

## 2013-06-25 NOTE — Discharge Instructions (Signed)
OK TO REMOVE BANDAGE AND SHOWER TODAY  LEAVE STERI-STRIPS ON INCISION AT LEAST ONE WEEK  MAY ALSO TAKE IBUPROFEN AND USE ICE PACK FOR PAIN

## 2013-06-25 NOTE — Op Note (Signed)
[name], [name] NO.:  [id number]  MEDICAL RECORD NO.:  [id number]  LOCATION:  6N31C                        FACILITY:  [facility]  PHYSICIAN:  Coralie Keens, M.D. DATE OF BIRTH:  03/08/1946  DATE OF PROCEDURE:  06/24/2013 DATE OF DISCHARGE:                              OPERATIVE REPORT   PREOPERATIVE DIAGNOSIS:  Right thyroid nodule.  POSTOPERATIVE DIAGNOSIS:  Right thyroid nodule.  PROCEDURE:  Right thyroid lobectomy.  SURGEON:  Coralie Keens, MD  ASSISTANT:  Earnstine Regal, MD  ANESTHESIA:  General endotracheal anesthesia.  ESTIMATED BLOOD LOSS:  Minimal.  INDICATION:  This is a 68 year old female who presents with multinodular goiter.  One of her dominant nodules on the right side is in biopsy revealing follicular and Hurthle cells.  Decision was made to proceed with a right thyroid lobectomy.  I have discussed with the patient and her family.  PROCEDURE IN DETAIL:  The patient was brought to the operating room, identified as the correct patient.  She was placed supine on the operating room table and general anesthesia was induced.  Her neck was then prepped and draped in usual sterile fashion.  A transverse incision was made across the lower neck to her previous scar.  This was taken down through the platysma with electrocautery.  Superior and inferior skin flaps were then created with electrocautery.  The midline was then identified and opened with the electrocautery.  The strap muscles were then retracted laterally and the dissection was carried down to the thyroid gland.  The thyroid again was then identified and controlled further.  The medial vein was identified and clipped with surgical clips and transected with a Harmonic Scalpel.  I then took down the superior pole with surgical clips, the Harmonic Scalpel as well and dissected the inferior pole as well.  Care was taken to stay right onto the thyroid gland.  I worked going down  towards the trachea.  The thyroid artery was identified and controlled with surgical clips.  The underlying recurrent laryngeal nerve was identified and spared.  Its course was followed completely to its entrance into the thyroid cartilage.  I then was able to slowly dissect the gland free from the underlying trachea.  Again, the nerve was examined and found to be out of the way of the dissection. The inferior and superior parathyroid glands were also easily identified and kept separate.  Once the gland was free from the trachea dissected towards the midline.  She also had large nodules on her left thyroid lobe.  Decision was made to come across the thyroid at the midportion. I then completely transected with the Harmonic Scalpel moving the entire right lobe of the thyroid and sent to pathology for evaluation. Hemostasis then appeared to be achieved.  I irrigated the neck with saline.  I then placed a fibular for hemostatic purposes in the incision.  I then closed the patient's midline with interrupted 3-0 Vicryl sutures.  I then reapproximated the platysma with interrupted 3-0 Vicryl sutures and closed the skin with running 4-0 Monocryl.  Steri-Strips, gauze, and a bandage were then applied.  The patient tolerated the procedure well.  All the counts were correct at the end  of the procedure.  The patient was then extubated in the operating room and taken in stable condition to the recovery room.     Coralie Keens, M.D.     DB/MEDQ  D:  06/24/2013  T:  06/25/2013  Job:  [id number]

## 2013-06-25 NOTE — Progress Notes (Signed)
Patient ID: [name] [name], female   DOB: September 02, 1945, 69 y.o.   MRN: [id number]  Doing well Neck stable  Will discharge home

## 2013-06-25 NOTE — Discharge Summary (Signed)
Physician Discharge Summary  Patient ID: [name] [name] MRN: [id number] DOB/AGE: 1945-12-17 68 y.o.  Admit date: 06/24/2013 Discharge date: 06/25/2013  Admission Diagnoses:  Discharge Diagnoses:  Active Problems:   Thyroid nodule   Discharged Condition: good  Hospital Course: uneventful post op recovery s/p thyroid lobectomy.  Discharged pod#1  Consults: None  Significant Diagnostic Studies:   Treatments: surgery: right thyroid lobectomy  Discharge Exam: Blood pressure 115/46, pulse 79, temperature 98.6 F (37 C), temperature source Oral, resp. rate 16, height 5\' 2"  (1.575 m), weight 143 lb (64.864 kg), SpO2 97.00%. Incision/Wound:looks good, voice strong, no neck hematoma  Disposition: Final discharge disposition not confirmed   Future Appointments Provider Department Dept Phone   07/07/2013 1:40 PM Harl Bowie, MD [facility], [place] [phone]       Medication List         enalapril 10 MG tablet  Commonly known as:  VASOTEC  Take 1 tablet (10 mg total) by mouth daily.     HYDROcodone-acetaminophen 5-325 MG per tablet  Commonly known as:  NORCO/VICODIN  Take 1-2 tablets by mouth every 4 (four) hours as needed for moderate pain.           Follow-up Information   Follow up with [facility] A, MD. Call in 2 weeks.   Specialty:  General Surgery   Contact information:   [address] [place] [address] [phone]       Signed: Harl Bowie 06/25/2013, 7:55 AM

## 2013-06-26 ENCOUNTER — Encounter (HOSPITAL_COMMUNITY): Payer: Self-pay | Admitting: Surgery

## 2013-06-26 ENCOUNTER — Telehealth (INDEPENDENT_AMBULATORY_CARE_PROVIDER_SITE_OTHER): Payer: Self-pay | Admitting: General Surgery

## 2013-06-26 NOTE — Telephone Encounter (Signed)
Pt's daughter called for advice on removing surgical bandage and cleansing the wound.  Went through the process and explained okay to shower, gently wash area with soap and water (no scrubbing with washcloth,) and can leave open to air or cover to keep clothing from irritation.  She understands all and appreciates our reassurance.

## 2013-07-07 ENCOUNTER — Encounter (INDEPENDENT_AMBULATORY_CARE_PROVIDER_SITE_OTHER): Payer: Self-pay | Admitting: Surgery

## 2013-07-07 ENCOUNTER — Ambulatory Visit (INDEPENDENT_AMBULATORY_CARE_PROVIDER_SITE_OTHER): Payer: Medicare HMO | Admitting: Surgery

## 2013-07-07 VITALS — BP 136/74 | HR 78 | Temp 97.9°F | Resp 14 | Ht 62.0 in | Wt 141.8 lb

## 2013-07-07 DIAGNOSIS — Z09 Encounter for follow-up examination after completed treatment for conditions other than malignant neoplasm: Secondary | ICD-10-CM

## 2013-07-07 NOTE — Progress Notes (Signed)
Subjective:     Patient ID: [name] [name], female   DOB: November 14, 1945, 68 y.o.   MRN: [id number]  HPI  She is here for her first postoperative visit status post right thyroid lobectomy. She is doing very well and has no complaints. Review of Systems     Objective:   Physical Exam On exam, her incision is healing well.  The final pathology showed benign multinodular goiter without evidence of malignancy    Assessment:     Patient stable postop     Plan:     I gave her a copy of the pathology report. I will see her back as needed

## 2013-10-11 ENCOUNTER — Other Ambulatory Visit: Payer: Self-pay | Admitting: Internal Medicine

## 2013-12-01 ENCOUNTER — Other Ambulatory Visit: Payer: Self-pay | Admitting: Internal Medicine

## 2013-12-06 ENCOUNTER — Other Ambulatory Visit: Payer: Self-pay | Admitting: Internal Medicine

## 2015-03-15 DIAGNOSIS — Z23 Encounter for immunization: Secondary | ICD-10-CM | POA: Diagnosis not present

## 2015-11-12 DIAGNOSIS — D649 Anemia, unspecified: Secondary | ICD-10-CM | POA: Diagnosis not present

## 2015-11-12 DIAGNOSIS — I10 Essential (primary) hypertension: Secondary | ICD-10-CM | POA: Diagnosis not present

## 2016-03-06 DIAGNOSIS — Z23 Encounter for immunization: Secondary | ICD-10-CM | POA: Diagnosis not present

## 2016-04-07 ENCOUNTER — Encounter: Payer: Self-pay | Admitting: Physician Assistant

## 2016-04-23 DIAGNOSIS — L03316 Cellulitis of umbilicus: Secondary | ICD-10-CM | POA: Diagnosis not present

## 2016-05-04 ENCOUNTER — Encounter: Payer: Self-pay | Admitting: Gastroenterology

## 2016-05-04 ENCOUNTER — Ambulatory Visit (INDEPENDENT_AMBULATORY_CARE_PROVIDER_SITE_OTHER): Payer: Medicare HMO | Admitting: Nurse Practitioner

## 2016-05-04 ENCOUNTER — Ambulatory Visit: Payer: Medicare HMO | Admitting: Physician Assistant

## 2016-05-04 ENCOUNTER — Encounter: Payer: Self-pay | Admitting: Nurse Practitioner

## 2016-05-04 VITALS — BP 146/84 | HR 86 | Ht 62.0 in | Wt 147.0 lb

## 2016-05-04 DIAGNOSIS — Z1211 Encounter for screening for malignant neoplasm of colon: Secondary | ICD-10-CM

## 2016-05-04 NOTE — Progress Notes (Addendum)
      HPI:  Patient is 70 year old female from [place], residing in the [place] for 16 years. She is here for colon cancer screening. Patient had a colonoscopy approximately 10 years ago in [place]. Exam was reportedly normal. Patient gives a history of hemorrhoid surgery in [place] many years ago. She has no GI complaints. Specifically, no bowel changes, blood in stool, unexplained weight loss, or abdominal pain. No family history of colon cancer   Past Medical History:  Diagnosis Date  . Anemia   . Asthma   . Hypertension   . Thyroid disease     Past Surgical History:  Procedure Laterality Date  . [place]  2003  . THYROIDECTOMY Right 06/24/2013   Procedure: THYROIDECTOMY;  Surgeon: Harl Bowie, MD;  Location: [facility];  Service: General;  Laterality: Right;   Family History  Problem Relation Age of Onset  . Hypertension Mother   . Hypertension Father   . Hypertension Sister    Social History  Substance Use Topics  . Smoking status: Never Smoker  . Smokeless tobacco: Never Used  . Alcohol use No   Current Outpatient Prescriptions  Medication Sig Dispense Refill  . enalapril (VASOTEC) 10 MG tablet TAKE ONE TABLET BY MOUTH ONCE DAILY PER DOCTOR NEEDS OFFICE VISIT 15 tablet 0   No current facility-administered medications for this visit.    No Known Allergies   Review of Systems: All systems reviewed and negative except where noted in HPI.    Physical Exam: BP (!) 146/84   Pulse 86   Ht 5\' 2"  (1.575 m)   Wt 147 lb (66.7 kg)   BMI 26.89 kg/m  Constitutional:  Well-developed, white female in no acute distress. Psychiatric: Normal mood and affect. Behavior is normal. HEENT: Normocephalic and atraumatic. Conjunctivae are normal. No scleral icterus. Neck supple.  Cardiovascular: Normal rate, regular rhythm.  Pulmonary/chest: Effort normal and breath sounds normal. No wheezing, rales or rhonchi. Abdominal: Soft, nondistended, nontender. Bowel sounds  active throughout. There are no masses palpable. No hepatomegaly. Extremities: no edema Lymphadenopathy: No cervical adenopathy noted. Neurological: Alert and oriented to person place and time. Skin: Skin is warm and dry. No rashes noted.   ASSESSMENT AND PLAN:   70 year old female for colon cancer screening, new to the practice. No alarm symptoms. Reportedly normal colonoscopy in [place] 10 years ago.  -Patient will be scheduled for a screening colonoscopy with possible polypectomy.  The risks and benefits of the procedure were discussed and the patient agrees to proceed.   Patient has worked out a day that daughter is able to bring her for a colonoscopy. Dr. Loletha Carrow has an opening on that day so procedure will be scheduled with him.    Tye Savoy NP 05/04/2016, 10:30 AM   Thank you for sending this case to me. I have reviewed the entire note, and the outlined plan seems appropriate.

## 2016-05-04 NOTE — Patient Instructions (Signed)
It has been recommended to you by your physician that you have a colonoscopy completed. Per your request, we did not schedule the procedure today. Please contact our office at [phone] should you decide to have the procedure completed.   You will need a care partner to stay with you the entire time and drive you home. Clear liquids the day before and the day of your procedure ONLY.

## 2016-06-06 DIAGNOSIS — Z23 Encounter for immunization: Secondary | ICD-10-CM | POA: Diagnosis not present

## 2016-06-06 DIAGNOSIS — E7800 Pure hypercholesterolemia, unspecified: Secondary | ICD-10-CM | POA: Diagnosis not present

## 2016-06-06 DIAGNOSIS — D649 Anemia, unspecified: Secondary | ICD-10-CM | POA: Diagnosis not present

## 2016-06-06 DIAGNOSIS — Z1211 Encounter for screening for malignant neoplasm of colon: Secondary | ICD-10-CM | POA: Diagnosis not present

## 2016-06-06 DIAGNOSIS — I10 Essential (primary) hypertension: Secondary | ICD-10-CM | POA: Diagnosis not present

## 2016-06-06 DIAGNOSIS — Z0000 Encounter for general adult medical examination without abnormal findings: Secondary | ICD-10-CM | POA: Diagnosis not present

## 2016-06-28 ENCOUNTER — Ambulatory Visit (AMBULATORY_SURGERY_CENTER): Payer: Self-pay

## 2016-06-28 VITALS — Ht 61.5 in | Wt 146.0 lb

## 2016-06-28 DIAGNOSIS — Z1211 Encounter for screening for malignant neoplasm of colon: Secondary | ICD-10-CM

## 2016-06-28 MED ORDER — SUPREP BOWEL PREP KIT 17.5-3.13-1.6 GM/177ML PO SOLN: 1.0000 | Freq: Once | ORAL | 0 refills | Status: AC

## 2016-06-28 NOTE — Progress Notes (Signed)
No allergies to eggs or soy No past problems with anesthesia No diet meds No home oxygen  Declined emmi 

## 2016-07-04 ENCOUNTER — Ambulatory Visit (AMBULATORY_SURGERY_CENTER): Payer: Medicare HMO | Admitting: Gastroenterology

## 2016-07-04 ENCOUNTER — Encounter: Payer: Self-pay | Admitting: Gastroenterology

## 2016-07-04 VITALS — BP 134/62 | HR 61 | Temp 96.9°F | Resp 11 | Ht 61.5 in | Wt 146.0 lb

## 2016-07-04 DIAGNOSIS — D122 Benign neoplasm of ascending colon: Secondary | ICD-10-CM | POA: Diagnosis not present

## 2016-07-04 DIAGNOSIS — D126 Benign neoplasm of colon, unspecified: Secondary | ICD-10-CM | POA: Diagnosis not present

## 2016-07-04 DIAGNOSIS — D1803 Hemangioma of intra-abdominal structures: Secondary | ICD-10-CM

## 2016-07-04 DIAGNOSIS — Z1211 Encounter for screening for malignant neoplasm of colon: Secondary | ICD-10-CM

## 2016-07-04 DIAGNOSIS — D120 Benign neoplasm of cecum: Secondary | ICD-10-CM

## 2016-07-04 DIAGNOSIS — D1809 Hemangioma of other sites: Secondary | ICD-10-CM | POA: Diagnosis not present

## 2016-07-04 DIAGNOSIS — Z1212 Encounter for screening for malignant neoplasm of rectum: Secondary | ICD-10-CM | POA: Diagnosis not present

## 2016-07-04 MED ORDER — SODIUM CHLORIDE 0.9 % IV SOLN: 500.0000 mL | INTRAVENOUS | Status: AC

## 2016-07-04 NOTE — Patient Instructions (Signed)
YOU HAD AN ENDOSCOPIC PROCEDURE TODAY AT [facility]:   Refer to the procedure report that was given to you for any specific questions about what was found during the examination.  If the procedure report does not answer your questions, please call your gastroenterologist to clarify.  If you requested that your care partner not be given the details of your procedure findings, then the procedure report has been included in a sealed envelope for you to review at your convenience later.  YOU SHOULD EXPECT: Some feelings of bloating in the abdomen. Passage of more gas than usual.  Walking can help get rid of the air that was put into your GI tract during the procedure and reduce the bloating. If you had a lower endoscopy (such as a colonoscopy or flexible sigmoidoscopy) you may notice spotting of blood in your stool or on the toilet paper. If you underwent a bowel prep for your procedure, you may not have a normal bowel movement for a few days.  Please Note:  You might notice some irritation and congestion in your nose or some drainage.  This is from the oxygen used during your procedure.  There is no need for concern and it should clear up in a day or so.  SYMPTOMS TO REPORT IMMEDIATELY:   Following lower endoscopy (colonoscopy or flexible sigmoidoscopy):  Excessive amounts of blood in the stool  Significant tenderness or worsening of abdominal pains  Swelling of the abdomen that is new, acute  Fever of 100F or higher  For urgent or emergent issues, a gastroenterologist can be reached at any hour by calling [phone].   DIET:  We do recommend a small meal at first, but then you may proceed to your regular diet.  Drink plenty of fluids but you should avoid alcoholic beverages for 24 hours.  ACTIVITY:  You should plan to take it easy for the rest of today and you should NOT DRIVE or use heavy machinery until tomorrow (because of the sedation medicines used during the test).     FOLLOW UP: Our staff will call the number listed on your records the next business day following your procedure to check on you and address any questions or concerns that you may have regarding the information given to you following your procedure. If we do not reach you, we will leave a message.  However, if you are feeling well and you are not experiencing any problems, there is no need to return our call.  We will assume that you have returned to your regular daily activities without incident.  If any biopsies were taken you will be contacted by phone or by letter within the next 1-3 weeks.  Please call us at [phone] if you have not heard about the biopsies in 3 weeks.   Thank you for allowing [place] to provide for your healthcare today.  SIGNATURES/CONFIDENTIALITY: You and/or your care partner have signed paperwork which will be entered into your electronic medical record.  These signatures attest to the fact that that the information above on your After Visit Summary has been reviewed and is understood.  Full responsibility of the confidentiality of this discharge information lies with you and/or your care-partner.

## 2016-07-04 NOTE — Op Note (Signed)
[facility] Patient Name: [name] [name] Procedure Date: 07/04/2016 10:15 AM MRN: [id number] Endoscopist: [place]. Loletha Carrow , MD Age: 71 Referring MD:  Date of Birth: [birth year] Gender: Female Account #: [id number] Procedure:                Colonoscopy Indications:              Screening for colorectal malignant neoplasm Medicines:                Monitored Anesthesia Care Procedure:                Pre-Anesthesia Assessment:                           - Prior to the procedure, a History and Physical                            was performed, and patient medications and                            allergies were reviewed. The patient's tolerance of                            previous anesthesia was also reviewed. The risks                            and benefits of the procedure and the sedation                            options and risks were discussed with the patient.                            All questions were answered, and informed consent                            was obtained. Prior Anticoagulants: The patient has                            taken no previous anticoagulant or antiplatelet                            agents. ASA Grade Assessment: II - A patient with                            mild systemic disease. After reviewing the risks                            and benefits, the patient was deemed in                            satisfactory condition to undergo the procedure.                           After obtaining informed consent, the colonoscope  was passed under direct vision. Throughout the                            procedure, the patient's blood pressure, pulse, and                            oxygen saturations were monitored continuously. The                            Model PCF-[id number]) scope was introduced                            through the anus and advanced to the the cecum,                            identified by  appendiceal orifice and ileocecal                            valve. The colonoscopy was performed without                            difficulty. The patient tolerated the procedure                            well. The quality of the bowel preparation was                            excellent. The ileocecal valve, appendiceal                            orifice, and rectum were photographed. The quality                            of the bowel preparation was evaluated using the                            BBPS [facility] Bowel Preparation Scale) with scores                            of: Right Colon = 2, Transverse Colon = 3 and Left                            Colon = 3. The total BBPS score equals 8. The bowel                            preparation used was SUPREP. Scope In: 10:30:33 AM Scope Out: 11:08:46 AM Scope Withdrawal Time: 0 hours 34 minutes 50 seconds  Total Procedure Duration: 0 hours 38 minutes 13 seconds  Findings:                 The digital rectal exam findings include decreased                            sphincter  tone.                           A diffuse area of severe melanosis was found in the                            entire colon.                           A 4 mm polyp was found in the cecum. The polyp was                            sessile. The polyp was removed with a cold snare.                            Resection and retrieval were complete. To stop                            active bleeding, two hemostatic clips were                            successfully placed. There was no bleeding at the                            end of the procedure. Area was successfully                            injected with 3 mL of a 1:10,000 solution of                            epinephrine for hemostasis of bleeding caused by                            the procedure.                           A 4 mm polyp was found in the proximal ascending                            colon. The polyp  was sessile. The polyp was removed                            with a cold snare. Resection and retrieval were                            complete.                           The exam was otherwise without abnormality on                            direct and retroflexion views. Complications:            No immediate complications. Estimated Blood Loss:     Estimated blood loss:  10 mL. Impression:               - Decreased sphincter tone found on digital rectal                            exam.                           - Melanosis in the colon.                           - One 4 mm polyp in the cecum, removed with a cold                            snare. Resected and retrieved. Clips were placed.                            Injected.                           - One 4 mm polyp in the proximal ascending colon,                            removed with a cold snare. Resected and retrieved.                           - The examination was otherwise normal on direct                            and retroflexion views. Recommendation:           - Patient has a contact number available for                            emergencies. The signs and symptoms of potential                            delayed complications were discussed with the                            patient. Return to normal activities tomorrow.                            Written discharge instructions were provided to the                            patient.                           - Resume previous diet.                           - Continue present medications.                           - No aspirin, ibuprofen, naproxen, or other  non-steroidal anti-inflammatory drugs for 2 weeks                            after polyp removal.                           - Await pathology results.                           - Repeat colonoscopy is recommended for                            surveillance. The colonoscopy date will be                             determined after pathology results from today's                            exam become available for review. Henry L. Loletha Carrow, MD 07/04/2016 11:15:35 AM This report has been signed electronically.

## 2016-07-04 NOTE — Progress Notes (Signed)
Spontaneous respirations throughout. VSS. Resting comfortably. To PACU on room air. Report to  Sara RN. 

## 2016-07-04 NOTE — Progress Notes (Signed)
Called to room to assist during endoscopic procedure.  Patient ID and intended procedure confirmed with present staff. Received instructions for my participation in the procedure from the performing physician.  

## 2016-07-04 NOTE — Progress Notes (Signed)
Pt's states no medical or surgical changes since previsit or office visit. 

## 2016-07-05 ENCOUNTER — Telehealth: Payer: Self-pay | Admitting: Gastroenterology

## 2016-07-05 ENCOUNTER — Telehealth: Payer: Self-pay

## 2016-07-05 NOTE — Telephone Encounter (Signed)
  Follow up Call-  Call back number 07/04/2016  Post procedure Call Back phone  # [phone]  Permission to leave phone message Yes  Some recent data might be hidden     Patient questions:  Do you have a fever, pain , or abdominal swelling? No. Pain Score  0 *  Have you tolerated food without any problems? Yes.    Have you been able to return to your normal activities? Yes.    Do you have any questions about your discharge instructions: Diet   No. Medications  No. Follow up visit  No.  Do you have questions or concerns about your Care? No.  Actions: * If pain score is 4 or above: No action needed, pain <4.

## 2016-07-06 NOTE — Telephone Encounter (Signed)
Spoke to daughter, explained that the clips were used to control bleeding after removal of polyp. Told her that once the results are back she would receive either a letter or phone call with results.

## 2016-07-10 ENCOUNTER — Encounter: Payer: Self-pay | Admitting: Gastroenterology

## 2017-01-22 ENCOUNTER — Other Ambulatory Visit: Payer: Self-pay | Admitting: Family Medicine

## 2017-01-22 DIAGNOSIS — Z1231 Encounter for screening mammogram for malignant neoplasm of breast: Secondary | ICD-10-CM

## 2017-02-09 ENCOUNTER — Ambulatory Visit
Admission: RE | Admit: 2017-02-09 | Discharge: 2017-02-09 | Disposition: A | Discharge status: Home / Self Care | Payer: Medicare HMO | Source: Ambulatory Visit | Attending: Family Medicine | Admitting: Family Medicine

## 2017-02-09 DIAGNOSIS — Z1231 Encounter for screening mammogram for malignant neoplasm of breast: Secondary | ICD-10-CM

## 2017-03-12 DIAGNOSIS — D649 Anemia, unspecified: Secondary | ICD-10-CM | POA: Diagnosis not present

## 2017-03-12 DIAGNOSIS — E7800 Pure hypercholesterolemia, unspecified: Secondary | ICD-10-CM | POA: Diagnosis not present

## 2017-03-12 DIAGNOSIS — I10 Essential (primary) hypertension: Secondary | ICD-10-CM | POA: Diagnosis not present

## 2017-03-12 DIAGNOSIS — Z23 Encounter for immunization: Secondary | ICD-10-CM | POA: Diagnosis not present

## 2017-09-10 DIAGNOSIS — D649 Anemia, unspecified: Secondary | ICD-10-CM | POA: Diagnosis not present

## 2017-09-10 DIAGNOSIS — E7800 Pure hypercholesterolemia, unspecified: Secondary | ICD-10-CM | POA: Diagnosis not present

## 2017-09-10 DIAGNOSIS — I10 Essential (primary) hypertension: Secondary | ICD-10-CM | POA: Diagnosis not present

## 2018-03-14 DIAGNOSIS — I10 Essential (primary) hypertension: Secondary | ICD-10-CM | POA: Diagnosis not present

## 2018-03-14 DIAGNOSIS — D649 Anemia, unspecified: Secondary | ICD-10-CM | POA: Diagnosis not present

## 2018-03-14 DIAGNOSIS — E7800 Pure hypercholesterolemia, unspecified: Secondary | ICD-10-CM | POA: Diagnosis not present

## 2018-03-18 ENCOUNTER — Other Ambulatory Visit: Payer: Self-pay | Admitting: Family Medicine

## 2018-03-18 DIAGNOSIS — Z1231 Encounter for screening mammogram for malignant neoplasm of breast: Secondary | ICD-10-CM

## 2018-03-23 DIAGNOSIS — R69 Illness, unspecified: Secondary | ICD-10-CM | POA: Diagnosis not present

## 2018-04-23 ENCOUNTER — Ambulatory Visit
Admission: RE | Admit: 2018-04-23 | Discharge: 2018-04-23 | Disposition: A | Discharge status: Home / Self Care | Payer: Medicare HMO | Source: Ambulatory Visit | Attending: Family Medicine | Admitting: Family Medicine

## 2018-04-23 DIAGNOSIS — Z1231 Encounter for screening mammogram for malignant neoplasm of breast: Secondary | ICD-10-CM | POA: Diagnosis not present

## 2018-05-16 ENCOUNTER — Encounter: Payer: Self-pay | Admitting: Nurse Practitioner

## 2018-05-16 ENCOUNTER — Ambulatory Visit: Payer: Medicare HMO | Admitting: Nurse Practitioner

## 2018-05-16 VITALS — BP 164/98 | HR 76 | Ht 60.5 in | Wt 145.0 lb

## 2018-05-16 DIAGNOSIS — K6289 Other specified diseases of anus and rectum: Secondary | ICD-10-CM

## 2018-05-16 DIAGNOSIS — K648 Other hemorrhoids: Secondary | ICD-10-CM

## 2018-05-16 MED ORDER — HYDROCORTISONE 2.5 % RE CREA: 1.0000 "application " | TOPICAL_CREAM | Freq: Two times a day (BID) | RECTAL | 1 refills | Status: DC

## 2018-05-16 NOTE — Patient Instructions (Signed)
Use Recticare three times a day as needed.   We have sent the following medications to your pharmacy for you to pick up at your convenience: Anusol cream at bedtime x 7 days

## 2018-05-16 NOTE — Progress Notes (Signed)
Chief Complaint:  recurrent anorectal pain      IMPRESSION and PLAN:    86. 72 yo female with chronic (years) intermittent anorectal pain according to the daughter. Patient gives history of remote hemorrhoid "surgery" and then "banding" procedure years later (both in [place] ).  Intermittent anorectal pain over the last several years has been of undetermined etiology per daughter. After seeing several different providers / negative exams patient was apparently told to " stop having repeat rectal exams" as nothing has ever been found.  -She has discomfort along left wall of anal canal but there is actually some mild thickening of tissue along right wall. On anoscopy there is a mildly inflamed hemorrhoid ( ? right posterior).  -We will treat with a week of steroid cream inside anal canal.  She is not bleeding and I doubt  pain is hemorrhoidal, especially since only mildly inflamed and internal hemorrhoids do not typically cause discomfort   -trial of Recticare as needed.  -I would like to see her back in a couple of weeks.  Right now I do not think the internal hemorrhoid is worth banding, especially since she is not bleeding.. If no improvement when I see her back then consider referral to colorectal surgery for possibly exam under anesthesia.  It does not sound like she is ever seen a colorectal surgeon  2. HTN, on Vasotec, elevated BP today  3.  History of adenomatous colon polyps.  Small adenoma without high-grade dysplasia removed at time of last colonoscopy February 2018.  Due for surveillance colonoscopy February 2023  HPI:     Patient is a 72 year old female from [place] who speaks limited [place].  Daughter is here to help translate/interpret .  I saw the patient one year ago for a screening colonoscopy.  She had the colonoscopy done by Dr. Loletha Carrow.   Colonoscopy February 2018 Decreased sphincter tone found on digital rectal exam. - Melanosis in the colon. - One 4 mm polyp in  the cecum, removed with a cold snare. Resected and retrieved. Clips were placed. Injected. - One 4 mm polyp in the proximal ascending colon, removed with a cold snare. Resected and retrieved. - The examination was otherwise normal on direct and retroflexion views.   Surgical [P], cecum, polyp - MUCOSAL HEMANGIOMA AND MELANOSIS COLI. NO ADENOMATOUS CHANGE OR MALIGNANCY IDENTIFIED. 2. Surgical [P], ascending, polyp - FRAGMENTS OF TUBULAR ADENOMA WITH MELANOSIS COLI. NO HIGH GRADE DYSPLASIA OR MALIGNANCY IDENTIFIED.   Patient comes in today for evaluation of anorectal pain.  Daughter says patient has had intermittent and a rectal pain for many years.  She apparently has a history of a remote hemorrhoidectomy in [place].  Several years ago it sounds like she underwent hemorrhoidal banding in [place].  She is continued to have intermittent and a rectal pain despite what daughter describes as unremarkable exams by several providers.  Nearly a week ago patient developed recurrent anorectal pain.  She has no associated bleeding.  She describes the pain as being constant not necessarily worse with sitting or bowel movements.  Pain is on her left inside the anal canal. She denies constipation/ straining   Review of systems:     No chest pain, no SOB, no fevers, no urinary sx   Past Medical History:  Diagnosis Date  . Anemia   . Asthma   . Hypertension   . Thyroid disease     Patient's surgical history, family medical history, social history, medications and  allergies were all reviewed in Epic   Creatinine clearance cannot be calculated (Patient's most recent lab result is older than the maximum 21 days allowed.)  Current Outpatient Medications  Medication Sig Dispense Refill  . enalapril (VASOTEC) 10 MG tablet TAKE ONE TABLET BY MOUTH ONCE DAILY PER DOCTOR NEEDS OFFICE VISIT 15 tablet 0  . Magnesium 250 MG TABS Take by mouth.    . Red Yeast Rice Extract 600 MG TABS Take by mouth.      Current Facility-Administered Medications  Medication Dose Route Frequency Provider Last Rate Last Dose  . 0.9 %  sodium chloride infusion  500 mL Intravenous Continuous Danis, Kirke Corin, MD        Physical Exam:     BP (!) 164/98 (BP Location: Left Arm, Patient Position: Sitting, Cuff Size: Normal)   Pulse 76   Ht 5' 0.5" (1.537 m) Comment: height measured without shoes  Wt 145 lb (65.8 kg)   BMI 27.85 kg/m   GENERAL:  Pleasant female in NAD PSYCH: : Cooperative, normal affect EENT:  conjunctiva pink, mucous membranes moist, neck supple without masses CARDIAC:  RRR, no murmur heard, no peripheral edema PULM: Normal respiratory effort, lungs CTA bilaterally, no wheezing ABDOMEN:  Nondistended, soft, nontender. No obvious masses,  normal bowel sounds RECTAL: no external lesions. No obvious fissures.  In left decubitus position she is also left wall of anus. There was some tissue in right posterior area which was non-tender.  Anoscope inserted without any discomfort to her. There was a mildly inflamed internal hemorrhoid which seem to be right posterior position.   Musculoskeletal:  Normal muscle tone, normal strength NEURO: Alert and oriented x 3, no focal neurologic deficits   Tye Savoy , NP 05/16/2018, 9:12 AM

## 2018-05-17 NOTE — Progress Notes (Signed)
Thank you for sending this case to me. I have reviewed the entire note, and the outlined plan seems appropriate.  If she is not improved at follow up and if any visible or palpable abnormality, please send to colorectal surgery for exam in their office.  Wilfrid Lund, MD

## 2018-05-24 ENCOUNTER — Ambulatory Visit: Payer: Medicare HMO | Admitting: Nurse Practitioner

## 2018-06-11 ENCOUNTER — Ambulatory Visit: Payer: Medicare HMO | Admitting: Nurse Practitioner

## 2018-12-19 DIAGNOSIS — D649 Anemia, unspecified: Secondary | ICD-10-CM | POA: Diagnosis not present

## 2018-12-19 DIAGNOSIS — R739 Hyperglycemia, unspecified: Secondary | ICD-10-CM | POA: Diagnosis not present

## 2018-12-19 DIAGNOSIS — E7800 Pure hypercholesterolemia, unspecified: Secondary | ICD-10-CM | POA: Diagnosis not present

## 2018-12-19 DIAGNOSIS — I10 Essential (primary) hypertension: Secondary | ICD-10-CM | POA: Diagnosis not present

## 2018-12-19 DIAGNOSIS — Z23 Encounter for immunization: Secondary | ICD-10-CM | POA: Diagnosis not present

## 2019-02-14 DIAGNOSIS — R69 Illness, unspecified: Secondary | ICD-10-CM | POA: Diagnosis not present

## 2019-07-04 ENCOUNTER — Ambulatory Visit: Payer: Medicare HMO | Attending: Internal Medicine

## 2019-07-04 DIAGNOSIS — Z23 Encounter for immunization: Secondary | ICD-10-CM | POA: Insufficient documentation

## 2019-07-04 NOTE — Progress Notes (Signed)
   Covid-19 Vaccination Clinic  Name:  [name] [name]    MRN: [id number] DOB: 1946-01-05  07/04/2019  [name] [name] was observed post Covid-19 immunization for 15 minutes without incidence. She was provided with Vaccine Information Sheet and instruction to access the V-Safe system.   [name] [name] was instructed to call 911 with any severe reactions post vaccine: Marland Kitchen Difficulty breathing  . Swelling of your face and throat  . A fast heartbeat  . A bad rash all over your body  . Dizziness and weakness    Immunizations Administered    Name Date Dose VIS Date Route   Pfizer COVID-19 Vaccine 07/04/2019  3:26 PM 0.3 mL 05/09/2019 Intramuscular   Manufacturer: [place]   Lot: [id number]   [place]: [id number]

## 2019-07-23 ENCOUNTER — Ambulatory Visit: Payer: Medicare HMO

## 2019-07-29 ENCOUNTER — Ambulatory Visit: Payer: Medicare HMO | Attending: Internal Medicine

## 2019-07-29 DIAGNOSIS — Z23 Encounter for immunization: Secondary | ICD-10-CM

## 2019-07-29 NOTE — Progress Notes (Signed)
   Covid-19 Vaccination Clinic  Name:  [name] [name]    MRN: [id number] DOB: 1946-01-17  07/29/2019  [name] [name] was observed post Covid-19 immunization for 15 minutes without incident. She was provided with Vaccine Information Sheet and instruction to access the V-Safe system.   [name] [name] was instructed to call 911 with any severe reactions post vaccine: Marland Kitchen Difficulty breathing  . Swelling of face and throat  . A fast heartbeat  . A bad rash all over body  . Dizziness and weakness   Immunizations Administered    Name Date Dose VIS Date Route   Pfizer COVID-19 Vaccine 07/29/2019  3:22 PM 0.3 mL 05/09/2019 Intramuscular   Manufacturer: [place]   Lot: [id number]   [place]: [id number]

## 2019-11-19 ENCOUNTER — Other Ambulatory Visit: Payer: Self-pay | Admitting: Family Medicine

## 2019-11-19 DIAGNOSIS — Z1231 Encounter for screening mammogram for malignant neoplasm of breast: Secondary | ICD-10-CM

## 2019-12-02 ENCOUNTER — Other Ambulatory Visit: Payer: Self-pay

## 2019-12-02 ENCOUNTER — Ambulatory Visit
Admission: RE | Admit: 2019-12-02 | Discharge: 2019-12-02 | Disposition: A | Discharge status: Home / Self Care | Payer: Medicare HMO | Source: Ambulatory Visit | Attending: Family Medicine | Admitting: Family Medicine

## 2019-12-02 DIAGNOSIS — Z1231 Encounter for screening mammogram for malignant neoplasm of breast: Secondary | ICD-10-CM

## 2020-02-06 DIAGNOSIS — W57XXXA Bitten or stung by nonvenomous insect and other nonvenomous arthropods, initial encounter: Secondary | ICD-10-CM | POA: Diagnosis not present

## 2020-02-06 DIAGNOSIS — S70362A Insect bite (nonvenomous), left thigh, initial encounter: Secondary | ICD-10-CM | POA: Diagnosis not present

## 2020-03-06 DIAGNOSIS — R69 Illness, unspecified: Secondary | ICD-10-CM | POA: Diagnosis not present

## 2020-06-18 IMAGING — MG DIGITAL SCREENING BILATERAL MAMMOGRAM WITH CAD
3 series · 3 of 3 positions shown · non-contrast
Comparison: Previous exam(s).

CLINICAL DATA: Screening.

EXAM:
DIGITAL SCREENING BILATERAL MAMMOGRAM WITH CAD

[L MLO]
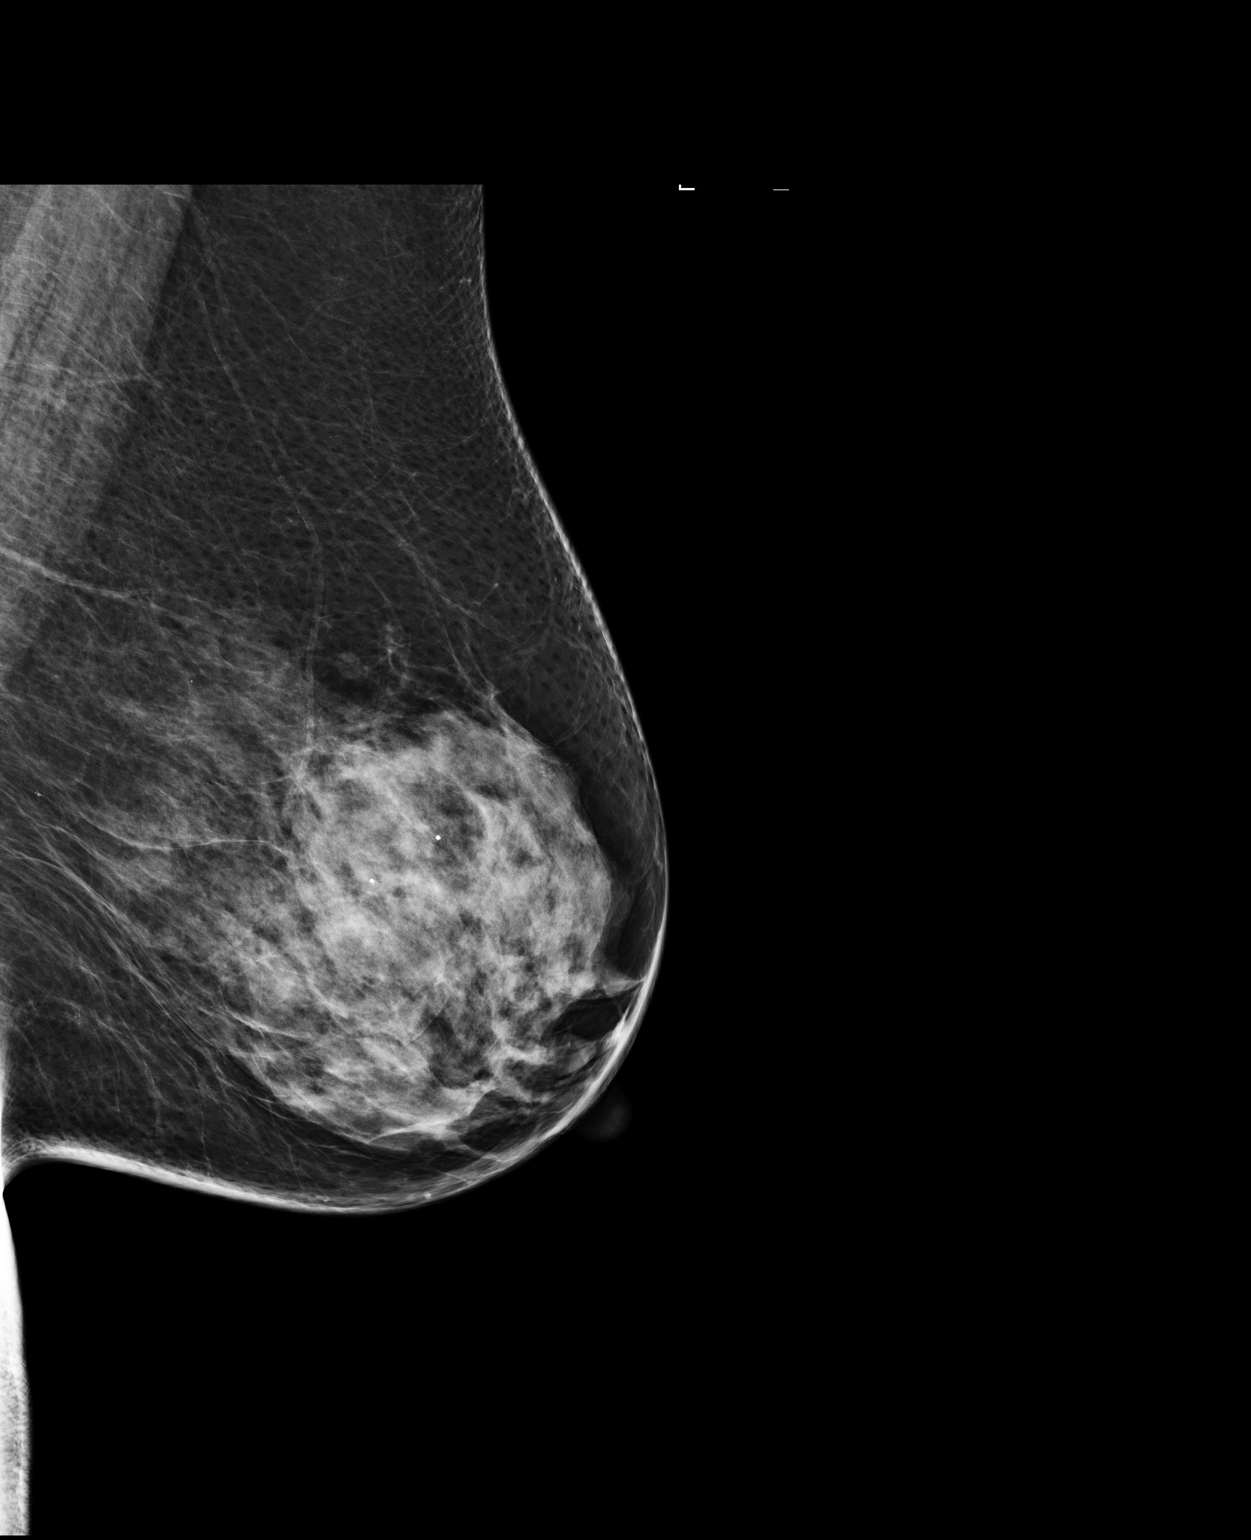

[R MLO]
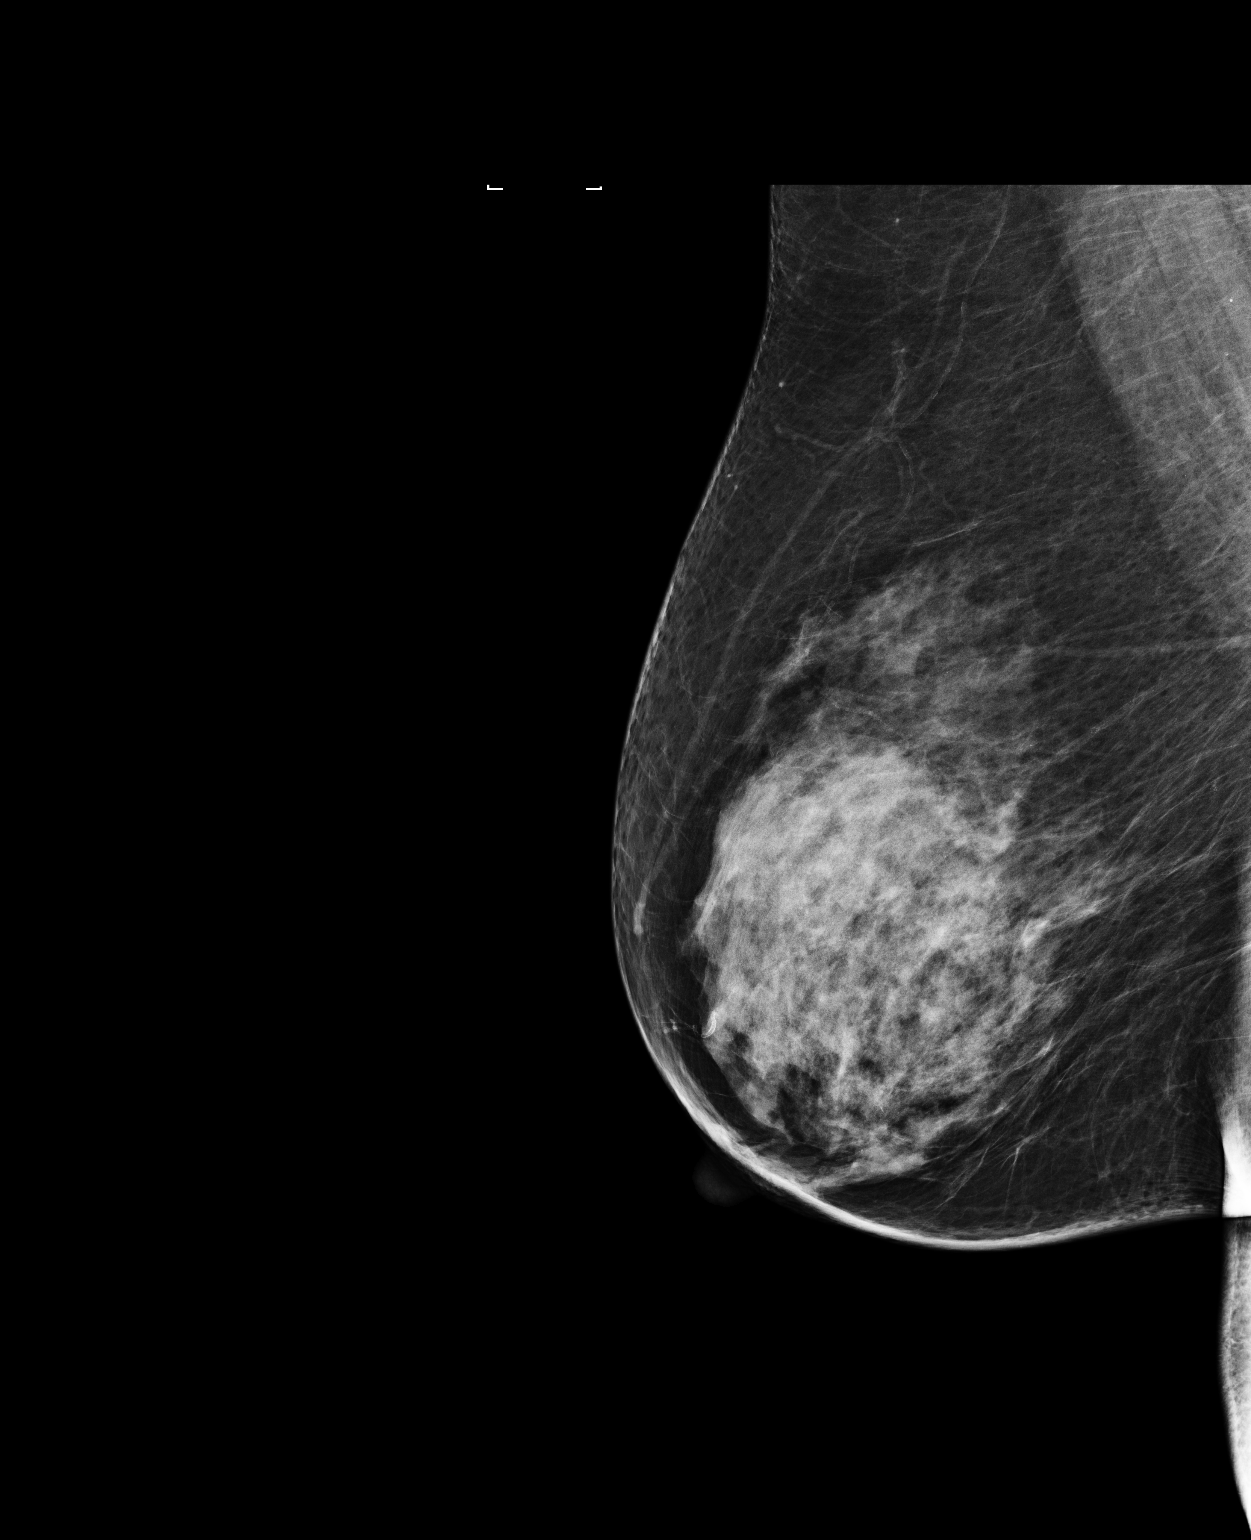

[L CC]
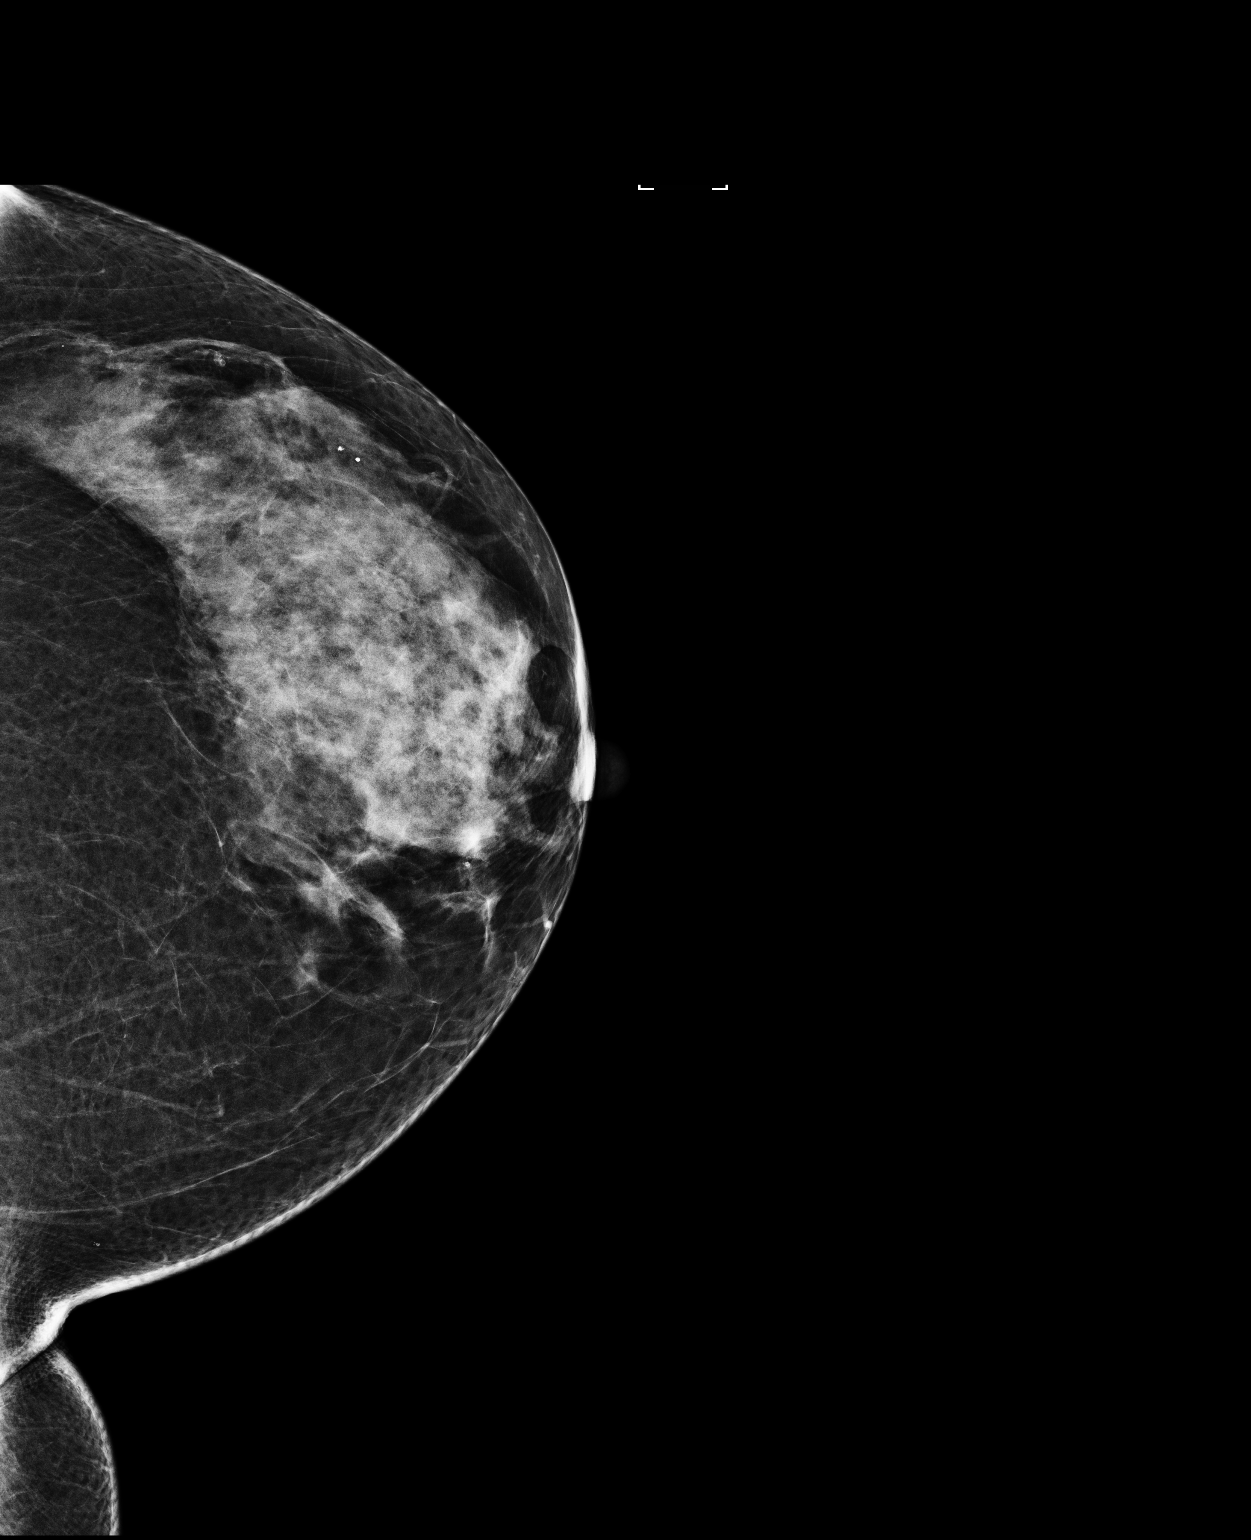

[3 of 3 positions shown; findings below may reference images not displayed]

ACR Breast Density Category d: The breast tissue is extremely dense,
which lowers the sensitivity of mammography
FINDINGS: There are no findings suspicious for malignancy. Images were
processed with CAD.
IMPRESSION: No mammographic evidence of malignancy. A result letter of this
screening mammogram will be mailed directly to the patient.

RECOMMENDATION:
Screening mammogram in one year. (Code:A5-2-HPS)

BI-RADS CATEGORY  1: Negative.

## 2020-09-16 DIAGNOSIS — I10 Essential (primary) hypertension: Secondary | ICD-10-CM | POA: Diagnosis not present

## 2020-09-16 DIAGNOSIS — D649 Anemia, unspecified: Secondary | ICD-10-CM | POA: Diagnosis not present

## 2020-09-16 DIAGNOSIS — E348 Other specified endocrine disorders: Secondary | ICD-10-CM | POA: Diagnosis not present

## 2020-09-16 DIAGNOSIS — E7800 Pure hypercholesterolemia, unspecified: Secondary | ICD-10-CM | POA: Diagnosis not present

## 2020-09-16 DIAGNOSIS — Z0000 Encounter for general adult medical examination without abnormal findings: Secondary | ICD-10-CM | POA: Diagnosis not present

## 2020-09-16 DIAGNOSIS — E038 Other specified hypothyroidism: Secondary | ICD-10-CM | POA: Diagnosis not present

## 2020-09-16 DIAGNOSIS — R739 Hyperglycemia, unspecified: Secondary | ICD-10-CM | POA: Diagnosis not present

## 2020-09-20 ENCOUNTER — Other Ambulatory Visit: Payer: Self-pay | Admitting: Family Medicine

## 2020-09-20 DIAGNOSIS — E2839 Other primary ovarian failure: Secondary | ICD-10-CM

## 2020-09-20 DIAGNOSIS — Z1231 Encounter for screening mammogram for malignant neoplasm of breast: Secondary | ICD-10-CM

## 2020-12-09 ENCOUNTER — Ambulatory Visit
Admission: RE | Admit: 2020-12-09 | Discharge: 2020-12-09 | Disposition: A | Discharge status: Home / Self Care | Payer: Medicare HMO | Source: Ambulatory Visit | Attending: Family Medicine | Admitting: Family Medicine

## 2020-12-09 ENCOUNTER — Other Ambulatory Visit: Payer: Self-pay

## 2020-12-09 DIAGNOSIS — Z1231 Encounter for screening mammogram for malignant neoplasm of breast: Secondary | ICD-10-CM

## 2020-12-13 ENCOUNTER — Ambulatory Visit: Payer: Medicare HMO

## 2021-03-30 ENCOUNTER — Ambulatory Visit
Admission: RE | Admit: 2021-03-30 | Discharge: 2021-03-30 | Disposition: A | Discharge status: Home / Self Care | Payer: Medicare HMO | Source: Ambulatory Visit | Attending: Family Medicine | Admitting: Family Medicine

## 2021-03-30 ENCOUNTER — Other Ambulatory Visit: Payer: Self-pay

## 2021-03-30 DIAGNOSIS — E2839 Other primary ovarian failure: Secondary | ICD-10-CM

## 2021-03-30 DIAGNOSIS — M85832 Other specified disorders of bone density and structure, left forearm: Secondary | ICD-10-CM | POA: Diagnosis not present

## 2021-03-30 DIAGNOSIS — M810 Age-related osteoporosis without current pathological fracture: Secondary | ICD-10-CM | POA: Diagnosis not present

## 2021-03-30 DIAGNOSIS — M85852 Other specified disorders of bone density and structure, left thigh: Secondary | ICD-10-CM | POA: Diagnosis not present

## 2021-05-06 ENCOUNTER — Other Ambulatory Visit: Payer: Self-pay

## 2021-05-06 ENCOUNTER — Emergency Department (HOSPITAL_COMMUNITY)
Admission: EM | Admit: 2021-05-06 | Discharge: 2021-05-06 | Disposition: A | Discharge status: Home / Self Care | Payer: Medicare HMO | Attending: Emergency Medicine | Admitting: Emergency Medicine

## 2021-05-06 DIAGNOSIS — R0902 Hypoxemia: Secondary | ICD-10-CM | POA: Diagnosis not present

## 2021-05-06 DIAGNOSIS — R1084 Generalized abdominal pain: Secondary | ICD-10-CM | POA: Diagnosis not present

## 2021-05-06 DIAGNOSIS — I10 Essential (primary) hypertension: Secondary | ICD-10-CM | POA: Insufficient documentation

## 2021-05-06 DIAGNOSIS — J45909 Unspecified asthma, uncomplicated: Secondary | ICD-10-CM | POA: Diagnosis not present

## 2021-05-06 NOTE — ED Triage Notes (Signed)
Pt bib ems for hypertension. Pt states she has had htn for 3 days at home. Pt has a hx of htn and takes lisonpril. Pt is axox4. Pt is complaint with meds.

## 2021-05-06 NOTE — Discharge Instructions (Signed)
Return to ED with any new or worsening symptoms such as chest pain, shortness of breath, blurry vision You need to follow-up in the next 3 to 5 days with your PCP for ongoing management of high blood pressure. Continue taking your prescribed blood pressure medications as directed.

## 2021-05-06 NOTE — ED Provider Notes (Signed)
[name] EMERGENCY DEPARTMENT Provider Note   CSN: [id number] Arrival date & time: 05/06/21  1241     History Chief Complaint  Patient presents with   Hypertension    [name] [name] is a 75 y.o. female with medical history significant for hypertension.  Patient states [name] last night she began to have low back pain and headaches last night [name] have subsided this morning.  Patient states [name] she checked her blood pressure this morning was 169 systolic.  Patient states she then called EMS and with paramedics it was 678 systolic.  On examination in the emergency room her blood pressure is currently 145/90.  Patient reports headaches, low back pain.  Patient denies blurry vision, chest pain, shortness of breath.  Patient states [name] she is urinating like normal.  The history is limited by a language barrier. A language interpreter was used.  Hypertension Associated symptoms include headaches. Pertinent negatives include no chest pain and no shortness of breath.      Past Medical History:  Diagnosis Date   Anemia    Asthma    Hypertension    Thyroid disease     Patient Active Problem List   Diagnosis Date Noted   Thyroid nodule 06/24/2013   Goiter 04/18/2013   HTN (hypertension) 07/17/2011    Past Surgical History:  Procedure Laterality Date   HEMORRHOID SURGERY  2003   THYROIDECTOMY Right 06/24/2013   Procedure: THYROIDECTOMY;  Surgeon: Harl Bowie, MD;  Location: [place];  Service: General;  Laterality: Right;     OB History   No obstetric history on file.     Family History  Problem Relation Age of Onset   Hypertension Mother    Hypertension Father    Hypertension Sister    Colon cancer Neg Hx    Breast cancer Neg Hx     Social History   Tobacco Use   Smoking status: Never   Smokeless tobacco: Never  Substance Use Topics   Alcohol use: No   Drug use: No    Home Medications   Allergies    Patient has no known  allergies.  Review of Systems   Review of Systems  Eyes:  Negative for visual disturbance.  Respiratory:  Negative for shortness of breath.   Cardiovascular:  Negative for chest pain.  Genitourinary:  Negative for difficulty urinating.  Musculoskeletal:  Positive for back pain.  Neurological:  Positive for headaches.  All other systems reviewed and are negative.  Physical Exam Updated Vital Signs BP (!) 177/96   Pulse 73   Temp 97.7 F (36.5 C) (Oral)   Resp 16   Ht 5\' 7"  (1.702 m)   Wt 64.4 kg   SpO2 100%   BMI 22.24 kg/m   Physical Exam Constitutional:      General: She is not in acute distress.    Appearance: Normal appearance. She is not toxic-appearing or diaphoretic.  Cardiovascular:     Rate and Rhythm: Normal rate and regular rhythm.     Pulses: Normal pulses.  Pulmonary:     Effort: Pulmonary effort is normal.     Breath sounds: Normal breath sounds. No wheezing.  Abdominal:     General: Abdomen is flat.     Palpations: Abdomen is soft.     Tenderness: There is no abdominal tenderness.  Skin:    General: Skin is warm and dry.     Capillary Refill: Capillary refill takes less than 2 seconds.  Neurological:     General: No focal deficit present.     Mental Status: She is alert and oriented to person, place, and time. Mental status is at baseline.     Cranial Nerves: No cranial nerve deficit.     Sensory: No sensory deficit.     Motor: No weakness.  Psychiatric:        Mood and Affect: Mood normal.    ED Results / Procedures / Treatments   Labs (all labs ordered are listed, but only abnormal results are displayed) Labs Reviewed - No data to display  EKG None  Radiology No results found.  Procedures Procedures   Medications Ordered in ED Medications - No data to display  ED Course  I have reviewed the triage vital signs and the nursing notes.  Pertinent labs & imaging results [name] were available during my care of the patient were reviewed  by me and considered in my medical decision making (see chart for details).    MDM Rules/Calculators/A&P                          A [place] interpreter was used with the duration of this interaction.  75 year old female presents with hypertensive episode.  Patient states [name] this morning her blood pressure was 540 systolic, then with EMS 086 systolic.  On examination patient blood pressure is 145/90.  Patient sitting up in bed, alert oriented.  Patient denies chest pain, shortness of breath.  Patient case discussed with Dr. Langston Masker who feels comfortable discharging this patient and having her follow-up with her primary care doctor.  I have given this patient discharge instructions and she expresses understanding. I have explained if she were to develop symptoms such as chest pain, SOB, blurry vision she needs to come back to the ED. Patient expressed understanding. I directed the patient to continue taking her blood pressure medication as prescribed and to follow up with her PCP in the coming days. She expressed understanding. This patient is agreeable with plans to be discharged. She is stable on discharge.   Final Clinical Impression(s) / ED Diagnoses Final diagnoses:  Primary hypertension    Rx / DC Orders ED Discharge Orders     None        [name] Cecil, PA-C 05/06/21 1447    Wyvonnia Dusky, MD 05/06/21 [phone]

## 2021-05-08 DIAGNOSIS — R1084 Generalized abdominal pain: Secondary | ICD-10-CM | POA: Diagnosis not present

## 2021-05-08 DIAGNOSIS — I10 Essential (primary) hypertension: Secondary | ICD-10-CM | POA: Diagnosis not present

## 2021-05-09 DIAGNOSIS — E7800 Pure hypercholesterolemia, unspecified: Secondary | ICD-10-CM | POA: Diagnosis not present

## 2021-05-09 DIAGNOSIS — I10 Essential (primary) hypertension: Secondary | ICD-10-CM | POA: Diagnosis not present

## 2021-06-01 DIAGNOSIS — I10 Essential (primary) hypertension: Secondary | ICD-10-CM | POA: Diagnosis not present

## 2021-06-01 DIAGNOSIS — E7800 Pure hypercholesterolemia, unspecified: Secondary | ICD-10-CM | POA: Diagnosis not present

## 2021-06-09 NOTE — Progress Notes (Signed)
Cardiology Office Note   Date:  06/10/2021   ID:  Kinslee, Dalpe 1945-06-28, MRN [id number]  PCP:  Marda Stalker, PA-C  Cardiologist:   None Referring:  Marda Stalker, PA-C   Chief Complaint  Patient presents with   Hypertension      History of Present Illness: [name] [name] is a 76 y.o. female who presents for evaluation of difficult to control HTN.   I reviewed some office records.  She went to the ED in early Dec with increased BP.  I see from her primary care office but I do not yet have the results.  We did use a [place] and I also spoke with her daughter.  She was recently started on amlodipine.  The patient was just very concerned about why her blood pressure suddenly was so elevated.  She has been taking it at home and she thinks is better since she had amlodipine added to her enalapril.  She still getting blood pressures apparently in the 160s at home but she did not bring her diary.  Her daughter who I spoke to on the phone was entirely sure how accurate the cuff was.  The patient otherwise denies any symptoms.  She is not having any chest pressure, neck or arm discomfort.  She is not having any new shortness of breath, PND or orthopnea.  She is not having any palpitations, presyncope or syncope.  She walks and takes care of 2 grandchildren.  She says she does some swimming.   Past Medical History:  Diagnosis Date   Anemia    Asthma    Hypertension    Thyroid disease     Past Surgical History:  Procedure Laterality Date   HEMORRHOID SURGERY  2003   THYROIDECTOMY Right 06/24/2013   Procedure: THYROIDECTOMY;  Surgeon: Harl Bowie, MD;  Location: [place];  Service: General;  Laterality: Right;     Current Outpatient Medications  Medication Sig Dispense Refill   alendronate (FOSAMAX) 70 MG tablet Take 70 mg by mouth once a week.     amLODipine (NORVASC) 5 MG tablet 1 tablet     enalapril (VASOTEC) 20 MG tablet 1  tablet     Magnesium 250 MG TABS Take by mouth.     pravastatin (PRAVACHOL) 80 MG tablet Take 80 mg by mouth daily.     Current Facility-Administered Medications  Medication Dose Route Frequency Provider Last Rate Last Admin   0.9 %  sodium chloride infusion  500 mL Intravenous Continuous Danis, Kirke Corin, MD        Allergies:   Patient has no known allergies.    Social History:  The patient  reports that she has never smoked. She has never used smokeless tobacco. She reports that she does not drink alcohol and does not use drugs.   Family History:  The patient's family history includes Heart attack (age of onset: 2) in her father; Hypertension in her father, mother, and sister.    ROS:  Please see the history of present illness.   Otherwise, review of systems are positive for none.   All other systems are reviewed and negative.    PHYSICAL EXAM: VS:  BP (!) 164/91    Pulse 80    Ht 5\' 2"  (1.575 m)    Wt 140 lb (63.5 kg)    BMI 25.61 kg/m  , BMI Body mass index is 25.61 kg/m. GENERAL:  Well appearing HEENT:  Pupils equal round  and reactive, fundi not visualized, oral mucosa unremarkable NECK:  No jugular venous distention, waveform within normal limits, carotid upstroke brisk and symmetric, no bruits, no thyromegaly LYMPHATICS:  No cervical, inguinal adenopathy LUNGS:  Clear to auscultation bilaterally BACK:  No CVA tenderness CHEST:  Unremarkable HEART:  PMI not displaced or sustained,S1 and S2 within normal limits, no S3, no S4, no clicks, no rubs, no murmurs ABD:  Flat, positive bowel sounds normal in frequency in pitch, no bruits, no rebound, no guarding, no midline pulsatile mass, no hepatomegaly, no splenomegaly EXT:  2 plus pulses throughout, no edema, no cyanosis no clubbing SKIN:  No rashes no nodules NEURO:  Cranial nerves II through XII grossly intact, motor grossly intact throughout PSYCH:  Cognitively intact, oriented to person place and time    EKG:  EKG is  ordered today. The ekg ordered today demonstrates Dr. Percival Spanish, rate 66, axis within normal limits, intervals within normal limits, no acute ST-T wave changes.   Recsinus rhythm Labs: No results found for requested labs within last 8760 hours.    Lipid Panel    Component Value Date/Time   CHOL 186 07/17/2011 1022   TRIG 59 07/17/2011 1022   HDL 54 07/17/2011 1022   CHOLHDL 3.4 07/17/2011 1022   VLDL 12 07/17/2011 1022   LDLCALC 120 (H) 07/17/2011 1022      Wt Readings from Last 3 Encounters:  06/10/21 140 lb (63.5 kg)  05/06/21 142 lb (64.4 kg)  05/16/18 145 lb (65.8 kg)      Other studies Reviewed: Additional studies/ records that were reviewed today include: ED records and PCP records.  ( (Greater than 45 minutes reviewing all data with greater than 50% face to face with the patient.  I called her daughter on the phone after the visit.  I brought in a translator.). Review of the above records demonstrates:  Please see elsewhere in the note.     ASSESSMENT AND PLAN:  HTN:    I spoke to her daughter and through the translator and asked the patient to start keeping a blood pressure diary 3 times a day for 2 weeks and send me the results in [place].  For now I am not going to change the medications.  Of note I do not strongly suspect a secondary cause for her hypertension.  I think it is slowly been creeping up over the years.  She likely will need some adjustment.  She also might need hydralazine as needed for hypertensive urgency as given the previous presentation.  I will will look for the results of the thyroid and other labs.  Further evaluation and change in therapy will be based on the blood pressure readings and review of these lab results.  She could also employ therapeutic lifestyle changes to include a few pounds of weight loss, continued physical activity and salt restriction.   Current medicines are reviewed at length with the patient today.  The patient does not have  concerns regarding medicines.  The following changes have been made:  no change  Labs/ tests ordered today include: None No orders of the defined types were placed in this encounter.    Disposition:   FU with me as needed based on the results of the blood pressure diary.   Signed, Minus Breeding, MD  06/10/2021 9:27 AM    [facility]

## 2021-06-10 ENCOUNTER — Encounter: Payer: Self-pay | Admitting: Cardiology

## 2021-06-10 ENCOUNTER — Ambulatory Visit: Payer: Medicare HMO | Admitting: Cardiology

## 2021-06-10 ENCOUNTER — Telehealth: Payer: Self-pay | Admitting: Cardiology

## 2021-06-10 ENCOUNTER — Other Ambulatory Visit: Payer: Self-pay

## 2021-06-10 VITALS — BP 164/91 | HR 80 | Ht 62.0 in | Wt 140.0 lb

## 2021-06-10 DIAGNOSIS — I10 Essential (primary) hypertension: Secondary | ICD-10-CM

## 2021-06-10 NOTE — Telephone Encounter (Signed)
Sharee Pimple from [facility] called saying she was returning Cheryl's call in regards to her requesting proton labs. Sharee Pimple advised they have not drawn these labs for the patient due to their office not being aware of her being on any blood thinners.

## 2021-06-10 NOTE — Patient Instructions (Signed)
Medication Instructions:  Continue same medications   Lab Work: None ordered   Testing/Procedures: None Ordered   Follow-Up: At [facility], you and your health needs are our priority.  As part of our continuing mission to provide you with exceptional heart care, we have created designated Provider Care Teams.  These Care Teams include your primary Cardiologist (physician) and Advanced Practice Providers (APPs -  Physician Assistants and Nurse Practitioners) who all work together to provide you with the care you need, when you need it.  We recommend signing up for the patient portal called "MyChart".  Sign up information is provided on this After Visit Summary.  MyChart is used to connect with patients for Virtual Visits (Telemedicine).  Patients are able to view lab/test results, encounter notes, upcoming appointments, etc.  Non-urgent messages can be sent to your provider as well.   To learn more about what you can do with MyChart, go to [email or web address].    Check Blood Pressure 3 times a day    Keep a log    Send reading in 2 weeks   Your next appointment:  As Needed    The format for your next appointment:  Office     Provider:  Dr.Hochrein

## 2021-06-17 NOTE — Telephone Encounter (Signed)
Dr.Hochrein was made aware 06/10/21 no labs.

## 2021-07-13 DIAGNOSIS — E7800 Pure hypercholesterolemia, unspecified: Secondary | ICD-10-CM | POA: Diagnosis not present

## 2021-07-13 DIAGNOSIS — I10 Essential (primary) hypertension: Secondary | ICD-10-CM | POA: Diagnosis not present

## 2021-07-25 ENCOUNTER — Encounter: Payer: Self-pay | Admitting: Gastroenterology

## 2021-09-26 DIAGNOSIS — E038 Other specified hypothyroidism: Secondary | ICD-10-CM | POA: Diagnosis not present

## 2021-09-26 DIAGNOSIS — Z23 Encounter for immunization: Secondary | ICD-10-CM | POA: Diagnosis not present

## 2021-09-26 DIAGNOSIS — E7800 Pure hypercholesterolemia, unspecified: Secondary | ICD-10-CM | POA: Diagnosis not present

## 2021-09-26 DIAGNOSIS — I10 Essential (primary) hypertension: Secondary | ICD-10-CM | POA: Diagnosis not present

## 2021-09-30 DIAGNOSIS — Z1389 Encounter for screening for other disorder: Secondary | ICD-10-CM | POA: Diagnosis not present

## 2021-09-30 DIAGNOSIS — Z0000 Encounter for general adult medical examination without abnormal findings: Secondary | ICD-10-CM | POA: Diagnosis not present

## 2021-10-20 DIAGNOSIS — K640 First degree hemorrhoids: Secondary | ICD-10-CM | POA: Diagnosis not present

## 2021-11-16 ENCOUNTER — Other Ambulatory Visit: Payer: Self-pay | Admitting: Family Medicine

## 2021-11-16 DIAGNOSIS — Z1231 Encounter for screening mammogram for malignant neoplasm of breast: Secondary | ICD-10-CM

## 2022-01-04 ENCOUNTER — Ambulatory Visit
Admission: RE | Admit: 2022-01-04 | Discharge: 2022-01-04 | Disposition: A | Discharge status: Home / Self Care | Payer: Medicare HMO | Source: Ambulatory Visit | Attending: Family Medicine | Admitting: Family Medicine

## 2022-01-04 DIAGNOSIS — Z1231 Encounter for screening mammogram for malignant neoplasm of breast: Secondary | ICD-10-CM

## 2022-02-14 ENCOUNTER — Encounter: Payer: Self-pay | Admitting: Gastroenterology

## 2022-03-07 ENCOUNTER — Ambulatory Visit (AMBULATORY_SURGERY_CENTER): Payer: Self-pay

## 2022-03-07 VITALS — Ht 62.0 in | Wt 135.0 lb

## 2022-03-07 DIAGNOSIS — Z86010 Personal history of colonic polyps: Secondary | ICD-10-CM

## 2022-03-07 MED ORDER — NA SULFATE-K SULFATE-MG SULF 17.5-3.13-1.6 GM/177ML PO SOLN: 1.0000 | ORAL | 0 refills | Status: DC

## 2022-03-07 NOTE — Progress Notes (Addendum)
No egg or soy allergy known to patient  No issues known to pt with past sedation with any surgeries or procedures Patient denies ever being told they had issues or difficulty with intubation  No FH of Malignant Hyperthermia Pt is not on diet pills Pt is not on  home 02  Pt is not on blood thinners  Pt reports occasional issues with constipation  No A fib or A flutter Have any cardiac testing pending--denied Pt instructed to use [employer] or GoodRx for a price reduction on prep   Pt speaks only some English. [place] interpreter was unavailable (2nd same day cancellation) but pt wished to proceed with visit, stating "I've done this several times". Daughter did assist via speaker phone. Suprep GoodRx coupon printed for [facility] and Rx sent to [place] at pt request. Suprep  English instructions printed for daughter to review at home.  I did not have pt sign consent today, alone, as she cannot read [place]. I called her daughter to request that she visit procedure with pt for purpose of interpreting as [facility] does not have in-person [place] interpreter and to read consent.  Daughter states she will "try to attend if her schedule allows".

## 2022-03-15 ENCOUNTER — Encounter: Payer: Self-pay | Admitting: Gastroenterology

## 2022-03-18 ENCOUNTER — Encounter: Payer: Self-pay | Admitting: Certified Registered Nurse Anesthetist

## 2022-03-24 ENCOUNTER — Encounter: Payer: Self-pay | Admitting: Gastroenterology

## 2022-03-24 ENCOUNTER — Ambulatory Visit (AMBULATORY_SURGERY_CENTER): Payer: Medicare HMO | Admitting: Gastroenterology

## 2022-03-24 VITALS — BP 129/74 | HR 69 | Temp 98.4°F | Resp 14 | Ht 62.0 in | Wt 135.0 lb

## 2022-03-24 DIAGNOSIS — D124 Benign neoplasm of descending colon: Secondary | ICD-10-CM

## 2022-03-24 DIAGNOSIS — I10 Essential (primary) hypertension: Secondary | ICD-10-CM | POA: Diagnosis not present

## 2022-03-24 DIAGNOSIS — Z09 Encounter for follow-up examination after completed treatment for conditions other than malignant neoplasm: Secondary | ICD-10-CM | POA: Diagnosis not present

## 2022-03-24 DIAGNOSIS — D123 Benign neoplasm of transverse colon: Secondary | ICD-10-CM

## 2022-03-24 DIAGNOSIS — Z86010 Personal history of colonic polyps: Secondary | ICD-10-CM | POA: Diagnosis not present

## 2022-03-24 MED ORDER — SODIUM CHLORIDE 0.9 % IV SOLN: 500.0000 mL | INTRAVENOUS | Status: AC

## 2022-03-24 NOTE — Progress Notes (Signed)
Report given to PACU, vss 

## 2022-03-24 NOTE — Op Note (Signed)
[facility] Patient Name: [name] [name] Procedure Date: 03/24/2022 3:05 PM MRN: [id number] Endoscopist: Marianna. Loletha Carrow , MD, [id number] Age: 76 Referring MD:  Date of Birth: March 04, 1946 Gender: Female Account #: [id number] Procedure:                Colonoscopy Indications:              Surveillance: Personal history of adenomatous                            polyps on last colonoscopy > 5 years ago Medicines:                Monitored Anesthesia Care Procedure:                Pre-Anesthesia Assessment:                           - Prior to the procedure, a History and Physical                            was performed, and patient medications and                            allergies were reviewed. The patient's tolerance of                            previous anesthesia was also reviewed. The risks                            and benefits of the procedure and the sedation                            options and risks were discussed with the patient.                            All questions were answered, and informed consent                            was obtained. Prior Anticoagulants: The patient has                            taken no anticoagulant or antiplatelet agents. ASA                            Grade Assessment: II - A patient with mild systemic                            disease. After reviewing the risks and benefits,                            the patient was deemed in satisfactory condition to                            undergo the procedure.  After obtaining informed consent, the colonoscope                            was passed under direct vision. Throughout the                            procedure, the patient's blood pressure, pulse, and                            oxygen saturations were monitored continuously. The                            Olympus CF-HQ190L (Serial# [id number]) Colonoscope was                            introduced  through the anus and advanced to the the                            cecum, identified by appendiceal orifice and                            ileocecal valve. The colonoscopy was somewhat                            difficult due to a redundant colon. Successful                            completion of the procedure was aided by using                            manual pressure and straightening and shortening                            the scope to obtain bowel loop reduction. The                            patient tolerated the procedure well. The quality                            of the bowel preparation was good. The ileocecal                            valve, appendiceal orifice, and rectum were                            photographed. Scope In: 3:16:37 PM Scope Out: 3:32:07 PM Scope Withdrawal Time: 0 hours 9 minutes 24 seconds  Total Procedure Duration: 0 hours 15 minutes 30 seconds  Findings:                 The perianal and digital rectal examinations were                            normal.  A single angioectasia without bleeding was found in                            the cecum.                           Two sessile polyps were found in the descending                            colon and transverse colon. The polyps were                            diminutive in size. These polyps were removed with                            a cold snare. Resection and retrieval were complete.                           A diffuse area of melanosis was found in the entire                            colon.                           The exam was otherwise without abnormality on                            direct and retroflexion views. Complications:            No immediate complications. Estimated Blood Loss:     Estimated blood loss was minimal. Impression:               - A single non-bleeding colonic angioectasia.                           - Two diminutive polyps in the  descending colon and                            in the transverse colon, removed with a cold snare.                            Resected and retrieved.                           - Melanosis in the colon.                           - The examination was otherwise normal on direct                            and retroflexion views. Recommendation:           - Patient has a contact number available for                            emergencies. The signs and symptoms of  potential                            delayed complications were discussed with the                            patient. Return to normal activities tomorrow.                            Written discharge instructions were provided to the                            patient.                           - Resume previous diet.                           - Continue present medications.                           - Await pathology results.                           - No repeat surveillance colonoscopy recommended                            due to age, current guidelines and low risk polyp                            history. Drishti Pepperman L. Loletha Carrow, MD 03/24/2022 3:38:04 PM This report has been signed electronically.

## 2022-03-24 NOTE — Progress Notes (Signed)
1530 Ephedrine 10 mg given IV due to low BP, MD updated.

## 2022-03-24 NOTE — Progress Notes (Signed)
Patient arrived in recovery.  We had an interpreter "on a stick" to translate for [place]. The doctor spoke with the patient through the interpreter and the RN as well.  Patient did understand a lot of [place].  The doctor called the patient's daughter(Sylvia) to give hr the report on the patient.  RN called the daughter to explain discharge instructions.  RN sent the paperwork with the patient to give to her daughter who speak English well.  Patient was discharged, and her husband got the car.  Patient did well.

## 2022-03-24 NOTE — Patient Instructions (Signed)
Spoke with patient's daughter Sunday Spillers) for discharge instruction confirmation and verbal signature of of paper.  YOU HAD AN ENDOSCOPIC PROCEDURE TODAY AT [facility]:   Refer to the procedure report that was given to you for any specific questions about what was found during the examination.  If the procedure report does not answer your questions, please call your gastroenterologist to clarify.  If you requested that your care partner not be given the details of your procedure findings, then the procedure report has been included in a sealed envelope for you to review at your convenience later.  YOU SHOULD EXPECT: Some feelings of bloating in the abdomen. Passage of more gas than usual.  Walking can help get rid of the air that was put into your GI tract during the procedure and reduce the bloating. If you had a lower endoscopy (such as a colonoscopy or flexible sigmoidoscopy) you may notice spotting of blood in your stool or on the toilet paper. If you underwent a bowel prep for your procedure, you may not have a normal bowel movement for a few days.  Please Note:  You might notice some irritation and congestion in your nose or some drainage.  This is from the oxygen used during your procedure.  There is no need for concern and it should clear up in a day or so.  SYMPTOMS TO REPORT IMMEDIATELY:  Following lower endoscopy (colonoscopy or flexible sigmoidoscopy):  Excessive amounts of blood in the stool  Significant tenderness or worsening of abdominal pains  Swelling of the abdomen that is new, acute  Fever of 100F or higher   For urgent or emergent issues, a gastroenterologist can be reached at any hour by calling [phone]. Do not use MyChart messaging for urgent concerns.    DIET:  We do recommend a small meal at first, but then you may proceed to your regular diet.  Drink plenty of fluids but you should avoid alcoholic beverages for 24 hours.  ACTIVITY:  You should  plan to take it easy for the rest of today and you should NOT DRIVE or use heavy machinery until tomorrow (because of the sedation medicines used during the test).    FOLLOW UP: Our staff will call the number listed on your records the next business day following your procedure.  We will call around 7:15- 8:00 am to check on you and address any questions or concerns that you may have regarding the information given to you following your procedure. If we do not reach you, we will leave a message.     If any biopsies were taken you will be contacted by phone or by letter within the next 1-3 weeks.  Please call us at [phone] if you have not heard about the biopsies in 3 weeks.    SIGNATURES/CONFIDENTIALITY: You and/or your care partner have signed paperwork which will be entered into your electronic medical record.  These signatures attest to the fact that that the information above on your After Visit Summary has been reviewed and is understood.  Full responsibility of the confidentiality of this discharge information lies with you and/or your care-partner.

## 2022-03-24 NOTE — Progress Notes (Signed)
History and Physical:  This patient presents for endoscopic testing for: Encounter Diagnosis  Name Primary?   Personal history of colonic polyps Yes    This is a 76 year old woman known to me from a screening colonoscopy in 2018, at which time diffuse melanosis was found and a diminutive tubular adenoma was removed.  There was an additional polypoid lesion removed from the cecum, after which there was post polypectomy bleeding controlled with epinephrine and 2 hemoclips.  Pathology on that lesion turned out to be a hemangioma.  This patient speaks [place] and does not speak [place].  She is accompanied by her non-English-speaking husband.  Video phone interpreter service will be used for procedural consent.  I called this patient's daughter Sunday Spillers (listed as her primary contact for healthcare purposes), she is very pleasant and helpful as well as speaking that goes quite well.  She knew her mother was here today, tells [place] that her mother understands the reason for procedure and was agreeable to having it done.  To help this patient feel more comfortable during the consent process, we will have her daughter on speaker phone while consent is obtained with the use of the video phone interpreter.  Patient is otherwise without complaints or active issues today.   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 (Patient not taking:  Reported on 03/07/2022)     alendronate (FOSAMAX) 70 MG tablet Take 70 mg by mouth once a week.     amLODipine (NORVASC) 5 MG tablet 1 tablet     [place] 450 MG CAPS Take by mouth.     enalapril (VASOTEC) 20 MG tablet 1 tablet     ibuprofen (ADVIL) 800 MG tablet Take 800 mg by mouth 3 (three) times daily. (Patient not taking: Reported on 03/07/2022)     Magnesium 250 MG TABS Take by mouth. (Patient not taking: Reported on 03/07/2022)     Multiple Vitamin (MULTI-VITAMIN DAILY PO) Take by mouth.     Na Sulfate-K Sulfate-Mg Sulf 17.5-3.13-1.6 GM/177ML SOLN Take 1 kit by mouth as directed. May use generic Suprep, no prior authorization. Take as directed. 354 mL 0   pravastatin (PRAVACHOL) 80 MG tablet Take 80 mg by mouth daily.     Current Facility-Administered Medications  Medication Dose Route Frequency Provider Last Rate Last Admin   0.9 %  sodium chloride infusion  500 mL Intravenous Continuous Danis, Estill Cotta III, MD          ___________________________________________________________________ Objective   Exam:  There were no vitals taken for this visit.  CV: regular , S1/S2 Resp: clear to auscultation bilaterally, normal RR and effort noted GI: soft, no tenderness, with active bowel sounds.   Assessment: Encounter Diagnosis  Name Primary?   Personal history of colonic polyps Yes     Plan: Colonoscopy   The patient is appropriate for an endoscopic procedure in the ambulatory setting.   -  Wilfrid Lund, MD

## 2022-03-24 NOTE — Progress Notes (Signed)
Called to room to assist during endoscopic procedure.  Patient ID and intended procedure confirmed with present staff. Received instructions for my participation in the procedure from the performing physician.  

## 2022-03-27 ENCOUNTER — Telehealth: Payer: Self-pay

## 2022-03-27 NOTE — Telephone Encounter (Signed)
  Follow up Call-     03/24/2022    2:09 PM  Call back number  Post procedure Call Back phone  # [phone] (May leave message with her daughter)  Permission to leave phone message Yes     Patient questions:  Do you have a fever, pain , or abdominal swelling? No. Pain Score  0 *  Have you tolerated food without any problems? Yes.    Have you been able to return to your normal activities? Yes.    Do you have any questions about your discharge instructions: Diet   No. Medications  No. Follow up visit  No.  Do you have questions or concerns about your Care? No.  Actions: * If pain score is 4 or above: No action needed, pain <4.

## 2022-03-30 DIAGNOSIS — M810 Age-related osteoporosis without current pathological fracture: Secondary | ICD-10-CM | POA: Diagnosis not present

## 2022-03-30 DIAGNOSIS — E038 Other specified hypothyroidism: Secondary | ICD-10-CM | POA: Diagnosis not present

## 2022-03-30 DIAGNOSIS — E7800 Pure hypercholesterolemia, unspecified: Secondary | ICD-10-CM | POA: Diagnosis not present

## 2022-03-30 DIAGNOSIS — Z6825 Body mass index (BMI) 25.0-25.9, adult: Secondary | ICD-10-CM | POA: Diagnosis not present

## 2022-03-30 DIAGNOSIS — I10 Essential (primary) hypertension: Secondary | ICD-10-CM | POA: Diagnosis not present

## 2022-03-31 ENCOUNTER — Encounter: Payer: Self-pay | Admitting: Gastroenterology

## 2022-10-03 DIAGNOSIS — E7800 Pure hypercholesterolemia, unspecified: Secondary | ICD-10-CM | POA: Diagnosis not present

## 2022-10-03 DIAGNOSIS — Z0000 Encounter for general adult medical examination without abnormal findings: Secondary | ICD-10-CM | POA: Diagnosis not present

## 2022-10-03 DIAGNOSIS — Z6825 Body mass index (BMI) 25.0-25.9, adult: Secondary | ICD-10-CM | POA: Diagnosis not present

## 2022-10-03 DIAGNOSIS — Z1389 Encounter for screening for other disorder: Secondary | ICD-10-CM | POA: Diagnosis not present

## 2022-10-03 DIAGNOSIS — I10 Essential (primary) hypertension: Secondary | ICD-10-CM | POA: Diagnosis not present

## 2022-10-03 DIAGNOSIS — D649 Anemia, unspecified: Secondary | ICD-10-CM | POA: Diagnosis not present

## 2022-10-03 DIAGNOSIS — E038 Other specified hypothyroidism: Secondary | ICD-10-CM | POA: Diagnosis not present

## 2022-10-03 DIAGNOSIS — E663 Overweight: Secondary | ICD-10-CM | POA: Diagnosis not present

## 2022-10-18 DIAGNOSIS — E890 Postprocedural hypothyroidism: Secondary | ICD-10-CM | POA: Diagnosis not present

## 2022-10-18 DIAGNOSIS — I10 Essential (primary) hypertension: Secondary | ICD-10-CM | POA: Diagnosis not present

## 2022-10-18 DIAGNOSIS — M810 Age-related osteoporosis without current pathological fracture: Secondary | ICD-10-CM | POA: Diagnosis not present

## 2022-10-18 DIAGNOSIS — D569 Thalassemia, unspecified: Secondary | ICD-10-CM | POA: Diagnosis not present

## 2022-10-18 DIAGNOSIS — E785 Hyperlipidemia, unspecified: Secondary | ICD-10-CM | POA: Diagnosis not present

## 2022-10-18 DIAGNOSIS — Z8249 Family history of ischemic heart disease and other diseases of the circulatory system: Secondary | ICD-10-CM | POA: Diagnosis not present

## 2022-10-18 DIAGNOSIS — Z008 Encounter for other general examination: Secondary | ICD-10-CM | POA: Diagnosis not present

## 2022-10-18 DIAGNOSIS — Z7983 Long term (current) use of bisphosphonates: Secondary | ICD-10-CM | POA: Diagnosis not present

## 2023-02-07 ENCOUNTER — Other Ambulatory Visit: Payer: Self-pay | Admitting: Family Medicine

## 2023-02-07 DIAGNOSIS — Z0000 Encounter for general adult medical examination without abnormal findings: Secondary | ICD-10-CM

## 2023-02-12 ENCOUNTER — Ambulatory Visit: Payer: Medicare HMO

## 2023-02-15 ENCOUNTER — Ambulatory Visit
Admission: RE | Admit: 2023-02-15 | Discharge: 2023-02-15 | Disposition: A | Discharge status: Home / Self Care | Payer: Medicare HMO | Source: Ambulatory Visit | Attending: Family Medicine | Admitting: Family Medicine

## 2023-02-15 ENCOUNTER — Ambulatory Visit: Payer: Medicare HMO

## 2023-02-15 DIAGNOSIS — Z1231 Encounter for screening mammogram for malignant neoplasm of breast: Secondary | ICD-10-CM | POA: Diagnosis not present

## 2023-02-15 DIAGNOSIS — Z0000 Encounter for general adult medical examination without abnormal findings: Secondary | ICD-10-CM

## 2023-02-22 ENCOUNTER — Ambulatory Visit: Payer: Medicare HMO

## 2023-04-05 ENCOUNTER — Other Ambulatory Visit: Payer: Self-pay | Admitting: Family Medicine

## 2023-04-05 DIAGNOSIS — E2839 Other primary ovarian failure: Secondary | ICD-10-CM

## 2023-04-05 DIAGNOSIS — D649 Anemia, unspecified: Secondary | ICD-10-CM | POA: Diagnosis not present

## 2023-04-05 DIAGNOSIS — M810 Age-related osteoporosis without current pathological fracture: Secondary | ICD-10-CM | POA: Diagnosis not present

## 2023-04-11 ENCOUNTER — Ambulatory Visit
Admission: RE | Admit: 2023-04-11 | Discharge: 2023-04-11 | Disposition: A | Discharge status: Home / Self Care | Payer: Medicare HMO | Source: Ambulatory Visit | Attending: Family Medicine | Admitting: Family Medicine

## 2023-04-11 DIAGNOSIS — E2839 Other primary ovarian failure: Secondary | ICD-10-CM | POA: Diagnosis not present

## 2023-04-11 DIAGNOSIS — M8588 Other specified disorders of bone density and structure, other site: Secondary | ICD-10-CM | POA: Diagnosis not present

## 2023-04-11 DIAGNOSIS — N958 Other specified menopausal and perimenopausal disorders: Secondary | ICD-10-CM | POA: Diagnosis not present

## 2023-05-28 DIAGNOSIS — H16223 Keratoconjunctivitis sicca, not specified as Sjogren's, bilateral: Secondary | ICD-10-CM | POA: Diagnosis not present

## 2023-06-05 DIAGNOSIS — H16223 Keratoconjunctivitis sicca, not specified as Sjogren's, bilateral: Secondary | ICD-10-CM | POA: Diagnosis not present

## 2023-07-05 DIAGNOSIS — Z0100 Encounter for examination of eyes and vision without abnormal findings: Secondary | ICD-10-CM | POA: Diagnosis not present

## 2023-07-05 DIAGNOSIS — H5203 Hypermetropia, bilateral: Secondary | ICD-10-CM | POA: Diagnosis not present

## 2023-07-05 DIAGNOSIS — H16223 Keratoconjunctivitis sicca, not specified as Sjogren's, bilateral: Secondary | ICD-10-CM | POA: Diagnosis not present

## 2023-07-05 DIAGNOSIS — H524 Presbyopia: Secondary | ICD-10-CM | POA: Diagnosis not present

## 2023-07-05 DIAGNOSIS — H2513 Age-related nuclear cataract, bilateral: Secondary | ICD-10-CM | POA: Diagnosis not present

## 2023-07-05 DIAGNOSIS — H52223 Regular astigmatism, bilateral: Secondary | ICD-10-CM | POA: Diagnosis not present

## 2023-08-14 DIAGNOSIS — H16223 Keratoconjunctivitis sicca, not specified as Sjogren's, bilateral: Secondary | ICD-10-CM | POA: Diagnosis not present

## 2023-10-16 DIAGNOSIS — E7800 Pure hypercholesterolemia, unspecified: Secondary | ICD-10-CM | POA: Diagnosis not present

## 2023-10-16 DIAGNOSIS — M810 Age-related osteoporosis without current pathological fracture: Secondary | ICD-10-CM | POA: Diagnosis not present

## 2023-10-16 DIAGNOSIS — E038 Other specified hypothyroidism: Secondary | ICD-10-CM | POA: Diagnosis not present

## 2023-10-16 DIAGNOSIS — I10 Essential (primary) hypertension: Secondary | ICD-10-CM | POA: Diagnosis not present

## 2023-12-16 DIAGNOSIS — K5900 Constipation, unspecified: Secondary | ICD-10-CM | POA: Diagnosis not present

## 2023-12-16 DIAGNOSIS — R110 Nausea: Secondary | ICD-10-CM | POA: Diagnosis not present

## 2023-12-16 DIAGNOSIS — I10 Essential (primary) hypertension: Secondary | ICD-10-CM | POA: Diagnosis not present

## 2023-12-16 DIAGNOSIS — R82998 Other abnormal findings in urine: Secondary | ICD-10-CM | POA: Diagnosis not present

## 2023-12-16 DIAGNOSIS — R1084 Generalized abdominal pain: Secondary | ICD-10-CM | POA: Diagnosis not present

## 2023-12-25 DIAGNOSIS — N390 Urinary tract infection, site not specified: Secondary | ICD-10-CM | POA: Diagnosis not present

## 2024-01-27 DIAGNOSIS — L089 Local infection of the skin and subcutaneous tissue, unspecified: Secondary | ICD-10-CM | POA: Diagnosis not present

## 2024-01-27 DIAGNOSIS — B354 Tinea corporis: Secondary | ICD-10-CM | POA: Diagnosis not present

## 2024-02-25 ENCOUNTER — Other Ambulatory Visit: Payer: Self-pay | Admitting: Family Medicine

## 2024-02-25 DIAGNOSIS — Z1231 Encounter for screening mammogram for malignant neoplasm of breast: Secondary | ICD-10-CM

## 2024-03-17 ENCOUNTER — Ambulatory Visit
Admission: RE | Admit: 2024-03-17 | Discharge: 2024-03-17 | Disposition: A | Discharge status: Home / Self Care | Source: Ambulatory Visit | Attending: Family Medicine | Admitting: Family Medicine

## 2024-03-17 DIAGNOSIS — Z1231 Encounter for screening mammogram for malignant neoplasm of breast: Secondary | ICD-10-CM | POA: Diagnosis not present

## 2024-04-17 DIAGNOSIS — D649 Anemia, unspecified: Secondary | ICD-10-CM | POA: Diagnosis not present

## 2024-04-17 DIAGNOSIS — M810 Age-related osteoporosis without current pathological fracture: Secondary | ICD-10-CM | POA: Diagnosis not present

## 2024-04-17 DIAGNOSIS — E7800 Pure hypercholesterolemia, unspecified: Secondary | ICD-10-CM | POA: Diagnosis not present

## 2024-04-17 DIAGNOSIS — I10 Essential (primary) hypertension: Secondary | ICD-10-CM | POA: Diagnosis not present

## 2024-04-17 DIAGNOSIS — E038 Other specified hypothyroidism: Secondary | ICD-10-CM | POA: Diagnosis not present

## 2024-04-17 DIAGNOSIS — Z6826 Body mass index (BMI) 26.0-26.9, adult: Secondary | ICD-10-CM | POA: Diagnosis not present

## 2024-04-17 DIAGNOSIS — E2839 Other primary ovarian failure: Secondary | ICD-10-CM | POA: Diagnosis not present

## 2024-04-21 DIAGNOSIS — R109 Unspecified abdominal pain: Secondary | ICD-10-CM | POA: Diagnosis not present

## 2024-07-01 NOTE — Progress Notes (Signed)
 Jeselle Hiser                                          MRN: [id number]   07/01/2024   The [facility] Quality Team Specialist reviewed this patient medical record for the purposes of chart review for care gap closure. The following were reviewed: chart review for care gap closure-controlling blood pressure.    [facility] Quality Team
